# Patient Record
Sex: Female | Born: 1970 | Race: White | Hispanic: No | Marital: Married | Smoking: Never smoker
Health system: Southern US, Community
[De-identification: ages and names within clinical notes are randomized; demographics above are authoritative.]

## PROBLEM LIST (undated history)

## (undated) DIAGNOSIS — M1990 Unspecified osteoarthritis, unspecified site: Secondary | ICD-10-CM

## (undated) DIAGNOSIS — E7800 Pure hypercholesterolemia, unspecified: Secondary | ICD-10-CM

## (undated) DIAGNOSIS — K219 Gastro-esophageal reflux disease without esophagitis: Secondary | ICD-10-CM

## (undated) DIAGNOSIS — Z8041 Family history of malignant neoplasm of ovary: Secondary | ICD-10-CM

## (undated) DIAGNOSIS — T7840XA Allergy, unspecified, initial encounter: Secondary | ICD-10-CM

## (undated) DIAGNOSIS — Z1371 Encounter for nonprocreative screening for genetic disease carrier status: Secondary | ICD-10-CM

## (undated) HISTORY — DX: Unspecified osteoarthritis, unspecified site: M19.90

## (undated) HISTORY — DX: Encounter for nonprocreative screening for genetic disease carrier status: Z13.71

## (undated) HISTORY — DX: Family history of malignant neoplasm of ovary: Z80.41

## (undated) HISTORY — PX: INDUCED ABORTION: SHX677

## (undated) HISTORY — PX: WISDOM TOOTH EXTRACTION: SHX21

## (undated) HISTORY — PX: MOUTH SURGERY: SHX715

## (undated) HISTORY — DX: Allergy, unspecified, initial encounter: T78.40XA

## (undated) HISTORY — PX: UPPER GI ENDOSCOPY: SHX6162

## (undated) HISTORY — PX: CERVICAL ABLATION: SHX5771

## (undated) HISTORY — DX: Pure hypercholesterolemia, unspecified: E78.00

---

## 1994-08-14 HISTORY — PX: CRYOTHERAPY: SHX1416

## 2012-12-12 DIAGNOSIS — Z1371 Encounter for nonprocreative screening for genetic disease carrier status: Secondary | ICD-10-CM

## 2012-12-12 HISTORY — DX: Encounter for nonprocreative screening for genetic disease carrier status: Z13.71

## 2014-10-13 HISTORY — PX: NOVASURE ABLATION: SHX5394

## 2016-05-03 ENCOUNTER — Encounter: Payer: Self-pay | Admitting: Family

## 2016-05-03 ENCOUNTER — Ambulatory Visit (INDEPENDENT_AMBULATORY_CARE_PROVIDER_SITE_OTHER): Payer: BLUE CROSS/BLUE SHIELD | Admitting: Family

## 2016-05-03 VITALS — BP 118/78 | HR 82 | Temp 98.1°F | Ht 60.5 in | Wt 113.4 lb

## 2016-05-03 DIAGNOSIS — Z0000 Encounter for general adult medical examination without abnormal findings: Secondary | ICD-10-CM | POA: Insufficient documentation

## 2016-05-03 DIAGNOSIS — Z7189 Other specified counseling: Secondary | ICD-10-CM

## 2016-05-03 DIAGNOSIS — K219 Gastro-esophageal reflux disease without esophagitis: Secondary | ICD-10-CM

## 2016-05-03 DIAGNOSIS — Z7689 Persons encountering health services in other specified circumstances: Secondary | ICD-10-CM

## 2016-05-03 MED ORDER — OMEPRAZOLE 20 MG PO CPDR: 20.0000 mg | DELAYED_RELEASE_CAPSULE | Freq: Every day | ORAL | 3 refills | Status: DC

## 2016-05-03 NOTE — Progress Notes (Signed)
Pre visit review using our clinic review tool, if applicable. No additional management support is needed unless otherwise documented below in the visit note. 

## 2016-05-03 NOTE — Progress Notes (Signed)
Subjective:    Patient ID: Lauren Leach, female    DOB: 1971-04-19, 45 y.o.   MRN: [id number]  CC: Lauren Leach is a 45 y.o. female who presents today for follow up.   HPI: Patient presents to establish care as a new patient. She had not prior PCP.   Follows with [facility]- due for mammogram and Pap per patient. Had uterine ablation for fibroids.   GERD- Worsening past couple of months however 'had it all her life.' Zantac OTC. Drinking alcohol more since son went to college.  Drinking only on the weekend- 3 vodka drinks per night. If drinks beer, drinks 4 beers per night.  Coffee, fried foods, chips triggers it. Drinking water helps. Epigastric burning when lays down at night. Endorses hoarseness and belching. No cough. 'Feels knot in throat' and difficultly swallowing. Has lost 2 pounds. Great aunt with throat cancer. No night sweats, fever. Denies exertional chest pain or pressure, numbness or tingling radiating to left arm or jaw, palpitations, dizziness, frequent headaches, changes in vision, or shortness of breath. No black or tarry stools. No h/o GIB.         HISTORY:  Past Medical History:  Diagnosis Date  . Allergy   . Arthritis    Past Surgical History:  Procedure Laterality Date  . CERVICAL ABLATION     2/2 fibroids   Family History  Problem Relation Age of Onset  . Cancer Mother     sarcoma; patient tested and negative  . Hyperlipidemia Father   . Heart disease Father   . Hypertension Sister   . Hypertension Maternal Aunt     Allergies: Review of patient's allergies indicates not on file. No current outpatient prescriptions on file prior to visit.   No current facility-administered medications on file prior to visit.     Social History  Substance Use Topics  . Smoking status: Never Smoker  . Smokeless tobacco: Never Used  . Alcohol use Yes     Comment: rinking only on the weekend- 3 vodka drinks per night. If drinks beer, drinks 4 beers  per night.    Review of Systems  Constitutional: Positive for unexpected weight change. Negative for chills and fever.  HENT: Positive for trouble swallowing.   Respiratory: Negative for cough, shortness of breath and wheezing.   Cardiovascular: Negative for chest pain and palpitations.  Gastrointestinal: Negative for abdominal distention, abdominal pain, blood in stool, constipation, nausea and vomiting.      Objective:    BP 118/78   Pulse 82   Temp 98.1 F (36.7 C) (Oral)   Ht 5' 0.5" (1.537 m)   Wt 113 lb 6.4 oz (51.4 kg)   SpO2 99%   BMI 21.78 kg/m  BP Readings from Last 3 Encounters:  05/03/16 118/78   Wt Readings from Last 3 Encounters:  05/03/16 113 lb 6.4 oz (51.4 kg)    Physical Exam  Constitutional: She appears well-developed and well-nourished.  Eyes: Conjunctivae are normal.  Cardiovascular: Normal rate, regular rhythm, normal heart sounds and normal pulses.   Pulmonary/Chest: Effort normal and breath sounds normal. She has no wheezes. She has no rhonchi. She has no rales.  Neurological: She is alert.  Skin: Skin is warm and dry.  Psychiatric: She has a normal mood and affect. Her speech is normal and behavior is normal. Thought content normal.  Vitals reviewed.      Assessment & Plan:   Problem List Items Addressed This  Visit      Digestive   GERD (gastroesophageal reflux disease) - Primary    Worsening. Concern as patient has lost 2 pounds recently and describes feeling a "knot in her throat". I placed referral to GI for consult. Advised patient to eliminate alcohol and anti-inflammatories as have concern for gastric ulcer. Patient verbalized understanding of this. Start Prilosec 20 mg daily.      Relevant Medications   omeprazole (PRILOSEC) 20 MG capsule   Other Relevant Orders   Ambulatory referral to Gastroenterology     Other   Encounter to establish care    Reviewed patient's past medical and surgical history. Reviewed patient's social  history and family history. Encouraged patient to decrease drinking since it has  increased since her son has gone to college. Patient verbalized understanding.Requesting records. Encouraged to make follow-up appointment with OB/GYN to schedule mammogram and Pap smear. Patient will follow-up with CPE in couple months.       Other Visit Diagnoses   None.      I am having Ms. Brubeck start on omeprazole.   Meds ordered this encounter  Medications  . omeprazole (PRILOSEC) 20 MG capsule    Sig: Take 1 capsule (20 mg total) by mouth daily.    Dispense:  30 capsule    Refill:  3    Order Specific Question:   Supervising Provider    Answer:   Crecencio Mc [[id number]]    Return precautions given.   Risks, benefits, and alternatives of the medications and treatment plan prescribed today were discussed, and patient expressed understanding.   Education regarding symptom management and diagnosis given to patient on AVS.  Continue to follow with Mable Paris, FNP for routine health maintenance.   Lauren Leach and I agreed with plan.   Mable Paris, FNP

## 2016-05-03 NOTE — Patient Instructions (Signed)
Please limit/eliminate alcohol and NSAIDs ( aleve, diclofenac) while we await appointment with GI.   If there is no improvement in your symptoms, or if there is any worsening of symptoms, or if you have any additional concerns, please return for re-evaluation; or, if w Gastroesophageal Reflux Disease, Adult Normally, food travels down the esophagus and stays in the stomach to be digested. However, when a person has gastroesophageal reflux disease (GERD), food and stomach acid move back up into the esophagus. When this happens, the esophagus becomes sore and inflamed. Over time, GERD can create small holes (ulcers) in the lining of the esophagus.  CAUSES This condition is caused by a problem with the muscle between the esophagus and the stomach (lower esophageal sphincter, or LES). Normally, the LES muscle closes after food passes through the esophagus to the stomach. When the LES is weakened or abnormal, it does not close properly, and that allows food and stomach acid to go back up into the esophagus. The LES can be weakened by certain dietary substances, medicines, and medical conditions, including:  Tobacco use.  Pregnancy.  Having a hiatal hernia.  Heavy alcohol use.  Certain foods and beverages, such as coffee, chocolate, onions, and peppermint. RISK FACTORS This condition is more likely to develop in:  People who have an increased body weight.  People who have connective tissue disorders.  People who use NSAID medicines. SYMPTOMS Symptoms of this condition include:  Heartburn.  Difficult or painful swallowing.  The feeling of having a lump in the throat.  Abitter taste in the mouth.  Bad breath.  Having a large amount of saliva.  Having an upset or bloated stomach.  Belching.  Chest pain.  Shortness of breath or wheezing.  Ongoing (chronic) cough or a night-time cough.  Wearing away of tooth enamel.  Weight loss. Different conditions can cause chest pain.  Make sure to see your health care provider if you experience chest pain. DIAGNOSIS Your health care provider will take a medical history and perform a physical exam. To determine if you have mild or severe GERD, your health care provider may also monitor how you respond to treatment. You may also have other tests, including:  An endoscopy toexamine your stomach and esophagus with a small camera.  A test thatmeasures the acidity level in your esophagus.  A test thatmeasures how much pressure is on your esophagus.  A barium swallow or modified barium swallow to show the shape, size, and functioning of your esophagus. TREATMENT The goal of treatment is to help relieve your symptoms and to prevent complications. Treatment for this condition may vary depending on how severe your symptoms are. Your health care provider may recommend:  Changes to your diet.  Medicine.  Surgery. HOME CARE INSTRUCTIONS Diet  Follow a diet as recommended by your health care provider. This may involve avoiding foods and drinks such as:  Coffee and tea (with or without caffeine).  Drinks that containalcohol.  Energy drinks and sports drinks.  Carbonated drinks or sodas.  Chocolate and cocoa.  Peppermint and mint flavorings.  Garlic and onions.  Horseradish.  Spicy and acidic foods, including peppers, chili powder, curry powder, vinegar, hot sauces, and barbecue sauce.  Citrus fruit juices and citrus fruits, such as oranges, lemons, and limes.  Tomato-based foods, such as red sauce, chili, salsa, and pizza with red sauce.  Fried and fatty foods, such as donuts, french fries, potato chips, and high-fat dressings.  High-fat meats, such as hot dogs and  fatty cuts of red and white meats, such as rib eye steak, sausage, ham, and bacon.  High-fat dairy items, such as whole milk, butter, and cream cheese.  Eat small, frequent meals instead of large meals.  Avoid drinking large amounts of liquid  with your meals.  Avoid eating meals during the 2-3 hours before bedtime.  Avoid lying down right after you eat.  Do not exercise right after you eat. General Instructions  Pay attention to any changes in your symptoms.  Take over-the-counter and prescription medicines only as told by your health care provider. Do not take aspirin, ibuprofen, or other NSAIDs unless your health care provider told you to do so.  Do not use any tobacco products, including cigarettes, chewing tobacco, and e-cigarettes. If you need help quitting, ask your health care provider.  Wear loose-fitting clothing. Do not wear anything tight around your waist that causes pressure on your abdomen.  Raise (elevate) the head of your bed 6 inches (15cm).  Try to reduce your stress, such as with yoga or meditation. If you need help reducing stress, ask your health care provider.  If you are overweight, reduce your weight to an amount that is healthy for you. Ask your health care provider for guidance about a safe weight loss goal.  Keep all follow-up visits as told by your health care provider. This is important. SEEK MEDICAL CARE IF:  You have new symptoms.  You have unexplained weight loss.  You have difficulty swallowing, or it hurts to swallow.  You have wheezing or a persistent cough.  Your symptoms do not improve with treatment.  You have a hoarse voice. SEEK IMMEDIATE MEDICAL CARE IF:  You have pain in your arms, neck, jaw, teeth, or back.  You feel sweaty, dizzy, or light-headed.  You have chest pain or shortness of breath.  You vomit and your vomit looks like blood or coffee grounds.  You faint.  Your stool is bloody or black.  You cannot swallow, drink, or eat.   This information is not intended to replace advice given to you by your health care provider. Make sure you discuss any questions you have with your health care provider.   Document Released: 05/10/2005 Document Revised:  04/21/2015 Document Reviewed: 11/25/2014 Elsevier Interactive Patient Education [employer]. e are closed, consider going to the Emergency Room for evaluation if symptoms urgent.

## 2016-05-03 NOTE — Assessment & Plan Note (Signed)
Worsening. Concern as patient has lost 2 pounds recently and describes feeling a "knot in her throat". I placed referral to GI for consult. Advised patient to eliminate alcohol and anti-inflammatories as have concern for gastric ulcer. Patient verbalized understanding of this. Start Prilosec 20 mg daily.

## 2016-05-03 NOTE — Assessment & Plan Note (Addendum)
Reviewed patient's past medical and surgical history. Reviewed patient's social history and family history. Encouraged patient to decrease drinking since it has  increased since her son has gone to college. Patient verbalized understanding.Requesting records. Encouraged to make follow-up appointment with OB/GYN to schedule mammogram and Pap smear. Patient will follow-up with CPE in couple months.

## 2016-05-09 ENCOUNTER — Encounter: Payer: Self-pay | Admitting: Family

## 2016-06-14 DIAGNOSIS — Z8041 Family history of malignant neoplasm of ovary: Secondary | ICD-10-CM

## 2016-06-14 HISTORY — DX: Family history of malignant neoplasm of ovary: Z80.41

## 2016-07-17 ENCOUNTER — Encounter: Payer: Self-pay | Admitting: Family

## 2016-07-17 ENCOUNTER — Ambulatory Visit
Admission: RE | Admit: 2016-07-17 | Discharge: 2016-07-17 | Disposition: A | Discharge status: Home / Self Care | Payer: BLUE CROSS/BLUE SHIELD | Source: Ambulatory Visit | Attending: Family | Admitting: Family

## 2016-07-17 ENCOUNTER — Ambulatory Visit (INDEPENDENT_AMBULATORY_CARE_PROVIDER_SITE_OTHER): Payer: BLUE CROSS/BLUE SHIELD | Admitting: Family

## 2016-07-17 VITALS — BP 128/78 | HR 89 | Temp 98.2°F | Ht 61.0 in | Wt 114.8 lb

## 2016-07-17 DIAGNOSIS — Z0001 Encounter for general adult medical examination with abnormal findings: Secondary | ICD-10-CM | POA: Diagnosis not present

## 2016-07-17 DIAGNOSIS — D259 Leiomyoma of uterus, unspecified: Secondary | ICD-10-CM | POA: Insufficient documentation

## 2016-07-17 DIAGNOSIS — Z0000 Encounter for general adult medical examination without abnormal findings: Secondary | ICD-10-CM

## 2016-07-17 DIAGNOSIS — Z7689 Persons encountering health services in other specified circumstances: Secondary | ICD-10-CM

## 2016-07-17 NOTE — Assessment & Plan Note (Signed)
Due to history of fibroids and left status suprapubic pain, concern for fibroid. Pending pelvic and transvaginal ultrasound.

## 2016-07-17 NOTE — Progress Notes (Signed)
Pre visit review using our clinic review tool, if applicable. No additional management support is needed unless otherwise documented below in the visit note. 

## 2016-07-17 NOTE — Progress Notes (Signed)
Subjective:    Patient ID: Lauren Leach, female    DOB: [birth year], 45 y.o.   MRN: [id number]  CC: Lauren Leach is a 45 y.o. female who presents today for physical exam.    HPI: H/o fibroids s/p ablation 2 years ago. Lately has been having intermittent left suprapubic pain. Sharp cramping pain. Endorses nausea with pain. Resolved with ibuprofen and heating pad. No pain with intercourse. Did have recent yeast infection and treated with OTC monitstat. No fever, chills, V, diarrhea. H/o of kidney stone.   LMP- Unsure. Doesn't bleed since ablation.     Colorectal Cancer Screening: No early family ; deferred.  Breast Cancer Screening: Due Cervical Cancer Screening: [facility]- had normal pap 2017  per patient. No records of this.  Bone Health screening/DEXA for 65+: No increased fracture risk. Defer screening at this time. Lung Cancer Screening: Doesn't have 30 year pack year history and age > 16 years. Immunizations       Tetanus - Due    HIV Screening- Candidate for Labs: Screening labs today. Exercise: Gets regular exercise.  Alcohol use: Occasional Smoking/tobacco use: Nonsmoker.  Regular dental exams: In need of dental exam. Wears seat belt: Yes.  HISTORY:  Past Medical History:  Diagnosis Date  . Allergy   . Arthritis     Past Surgical History:  Procedure Laterality Date  . CERVICAL ABLATION     2/2 fibroids   Family History  Problem Relation Age of Onset  . Cancer Mother     ovarian sarcoma; patient tested and negative  . Skin cancer Mother   . Hyperlipidemia Father   . Heart disease Father   . Hypertension Sister   . Skin cancer Sister     thinks basal  . Hypertension Maternal Aunt   . Skin cancer Maternal Aunt   . Colon cancer Neg Hx       ALLERGIES: Patient has no allergy information on record.  Current Outpatient Prescriptions on File Prior to Visit  Medication Sig Dispense Refill  . omeprazole (PRILOSEC) 20 MG capsule Take 1  capsule (20 mg total) by mouth daily. 30 capsule 3   No current facility-administered medications on file prior to visit.     Social History  Substance Use Topics  . Smoking status: Never Smoker  . Smokeless tobacco: Never Used  . Alcohol use Yes     Comment: rinking only on the weekend- 3 vodka drinks per night. If drinks beer, drinks 4 beers per night.    Review of Systems  Constitutional: Negative for chills, fever and unexpected weight change.  HENT: Negative for congestion.   Respiratory: Negative for cough.   Cardiovascular: Negative for chest pain, palpitations and leg swelling.  Gastrointestinal: Positive for nausea. Negative for abdominal distention, abdominal pain, constipation, diarrhea and vomiting.  Genitourinary: Negative for dysuria, frequency, vaginal bleeding, vaginal discharge and vaginal pain.  Musculoskeletal: Negative for arthralgias and myalgias.  Skin: Negative for rash.  Neurological: Negative for headaches.  Hematological: Negative for adenopathy.  Psychiatric/Behavioral: Negative for confusion.      Objective:    BP 128/78   Pulse 89   Temp 98.2 F (36.8 C) (Oral)   Ht 5\' 1"  (1.549 m)   Wt 114 lb 12 oz (52.1 kg)   SpO2 99%   BMI 21.68 kg/m   BP Readings from Last 3 Encounters:  07/17/16 128/78  05/03/16 118/78   Wt Readings from Last 3 Encounters:  07/17/16 114  lb 12 oz (52.1 kg)  05/03/16 113 lb 6.4 oz (51.4 kg)    Physical Exam  Constitutional: She appears well-developed and well-nourished.  Eyes: Conjunctivae are normal.  Neck: No thyroid mass and no thyromegaly present.  Cardiovascular: Normal rate, regular rhythm, normal heart sounds and normal pulses.   Pulmonary/Chest: Effort normal and breath sounds normal. She has no wheezes. She has no rhonchi. She has no rales. Right breast exhibits no inverted nipple, no mass, no nipple discharge, no skin change and no tenderness. Left breast exhibits no inverted nipple, no mass, no nipple  discharge, no skin change and no tenderness. Breasts are symmetrical.  CBE performed.   Abdominal: There is no CVA tenderness.  Genitourinary: There is no rash, tenderness or lesion on the right labia. There is no rash, tenderness or lesion on the left labia. Cervix exhibits no motion tenderness and no discharge. Right adnexum displays no mass, no tenderness and no fullness. Left adnexum displays tenderness. Left adnexum displays no mass and no fullness. No erythema, tenderness or bleeding in the vagina. No foreign body in the vagina. No vaginal discharge found.  Genitourinary Comments: No vulvovaginal erythema. No lesions. Discharge is thin and clear, not purulent.   Lymphadenopathy:       Head (right side): No submental, no submandibular, no tonsillar, no preauricular, no posterior auricular and no occipital adenopathy present.       Head (left side): No submental, no submandibular, no tonsillar, no preauricular, no posterior auricular and no occipital adenopathy present.    She has no cervical adenopathy.       Right cervical: No superficial cervical, no deep cervical and no posterior cervical adenopathy present.      Left cervical: No superficial cervical, no deep cervical and no posterior cervical adenopathy present.    She has no axillary adenopathy.  Neurological: She is alert.  Skin: Skin is warm and dry.  Psychiatric: She has a normal mood and affect. Her speech is normal and behavior is normal. Thought content normal.  Vitals reviewed.      Assessment & Plan:   Problem List Items Addressed This Visit      Genitourinary   Uterine fibroid    Due to history of fibroids and left status suprapubic pain, concern for fibroid. Pending pelvic and transvaginal ultrasound.      Relevant Orders   US OB Transvaginal   US Pelvis Complete     Other   Encounter to establish care    Due for mammogram; patient will schedule. Up-to-date on Pap smear per patient. Requesting records from  patient. Deferred DEXA scan and colonoscopy. Advised tetanus local pharmacy due to cost. Screening labs including HIV. CBE performed. Encouraged regular exercise.       Other Visit Diagnoses    Routine physical examination    -  Primary   Relevant Orders   CBC with Differential/Platelet   Comprehensive metabolic panel   Hemoglobin A1c   HIV antibody   Lipid panel   TSH   VITAMIN D 25 Hydroxy (Vit-D Deficiency, Fractures)   MM DIGITAL SCREENING BILATERAL   Ambulatory referral to Dermatology       I am having Ms. Brandes maintain her omeprazole.   No orders of the defined types were placed in this encounter.   Return precautions given.   Risks, benefits, and alternatives of the medications and treatment plan prescribed today were discussed, and patient expressed understanding.   Education regarding symptom management and diagnosis given to  patient on AVS.   Continue to follow with Mable Paris, FNP for routine health maintenance.   Lauren Leach and I agreed with plan.   Mable Paris, FNP

## 2016-07-17 NOTE — Patient Instructions (Addendum)
Tetanus vaccine at pharmacy   Fasting labs next week.  Abdominal US for suspect fibroids  Health Maintenance, Female Introduction Adopting a healthy lifestyle and getting preventive care can go a long way to promote health and wellness. Talk with your health care provider about what schedule of regular examinations is right for you. This is a good chance for you to check in with your provider about disease prevention and staying healthy. In between checkups, there are plenty of things you can do on your own. Experts have done a lot of research about which lifestyle changes and preventive measures are most likely to keep you healthy. Ask your health care provider for more information. Weight and diet Eat a healthy diet  Be sure to include plenty of vegetables, fruits, low-fat dairy products, and lean protein.  Do not eat a lot of foods high in solid fats, added sugars, or salt.  Get regular exercise. This is one of the most important things you can do for your health.  Most adults should exercise for at least 150 minutes each week. The exercise should increase your heart rate and make you sweat (moderate-intensity exercise).  Most adults should also do strengthening exercises at least twice a week. This is in addition to the moderate-intensity exercise. Maintain a healthy weight  Body mass index (BMI) is a measurement that can be used to identify possible weight problems. It estimates body fat based on height and weight. Your health care provider can help determine your BMI and help you achieve or maintain a healthy weight.  For females 23 years of age and older:  A BMI below 18.5 is considered underweight.  A BMI of 18.5 to 24.9 is normal.  A BMI of 25 to 29.9 is considered overweight.  A BMI of 30 and above is considered obese. Watch levels of cholesterol and blood lipids  You should start having your blood tested for lipids and cholesterol at 44 years of age, then have this  test every 5 years.  You may need to have your cholesterol levels checked more often if:  Your lipid or cholesterol levels are high.  You are older than 44 years of age.  You are at high risk for heart disease. Cancer screening Lung Cancer  Lung cancer screening is recommended for adults 36-32 years old who are at high risk for lung cancer because of a history of smoking.  A yearly low-dose CT scan of the lungs is recommended for people who:  Currently smoke.  Have quit within the past 15 years.  Have at least a 30-pack-year history of smoking. A pack year is smoking an average of one pack of cigarettes a day for 1 year.  Yearly screening should continue until it has been 15 years since you quit.  Yearly screening should stop if you develop a health problem that would prevent you from having lung cancer treatment. Breast Cancer  Practice breast self-awareness. This means understanding how your breasts normally appear and feel.  It also means doing regular breast self-exams. Let your health care provider know about any changes, no matter how small.  If you are in your 20s or 30s, you should have a clinical breast exam (CBE) by a health care provider every 1-3 years as part of a regular health exam.  If you are 41 or older, have a CBE every year. Also consider having a breast X-ray (mammogram) every year.  If you have a family history of breast cancer, talk to  your health care provider about genetic screening.  If you are at high risk for breast cancer, talk to your health care provider about having an MRI and a mammogram every year.  Breast cancer gene (BRCA) assessment is recommended for women who have family members with BRCA-related cancers. BRCA-related cancers include:  Breast.  Ovarian.  Tubal.  Peritoneal cancers.  Results of the assessment will determine the need for genetic counseling and BRCA1 and BRCA2 testing. Cervical Cancer  Your health care provider  may recommend that you be screened regularly for cancer of the pelvic organs (ovaries, uterus, and vagina). This screening involves a pelvic examination, including checking for microscopic changes to the surface of your cervix (Pap test). You may be encouraged to have this screening done every 3 years, beginning at age 21.  For women ages 30-65, health care providers may recommend pelvic exams and Pap testing every 3 years, or they may recommend the Pap and pelvic exam, combined with testing for human papilloma virus (HPV), every 5 years. Some types of HPV increase your risk of cervical cancer. Testing for HPV may also be done on women of any age with unclear Pap test results.  Other health care providers may not recommend any screening for nonpregnant women who are considered low risk for pelvic cancer and who do not have symptoms. Ask your health care provider if a screening pelvic exam is right for you.  If you have had past treatment for cervical cancer or a condition that could lead to cancer, you need Pap tests and screening for cancer for at least 20 years after your treatment. If Pap tests have been discontinued, your risk factors (such as having a new sexual partner) need to be reassessed to determine if screening should resume. Some women have medical problems that increase the chance of getting cervical cancer. In these cases, your health care provider may recommend more frequent screening and Pap tests. Colorectal Cancer  This type of cancer can be detected and often prevented.  Routine colorectal cancer screening usually begins at 44 years of age and continues through 44 years of age.  Your health care provider may recommend screening at an earlier age if you have risk factors for colon cancer.  Your health care provider may also recommend using home test kits to check for hidden blood in the stool.  A small camera at the end of a tube can be used to examine your colon directly  (sigmoidoscopy or colonoscopy). This is done to check for the earliest forms of colorectal cancer.  Routine screening usually begins at age 50.  Direct examination of the colon should be repeated every 5-10 years through 44 years of age. However, you may need to be screened more often if early forms of precancerous polyps or small growths are found. Skin Cancer  Check your skin from head to toe regularly.  Tell your health care provider about any new moles or changes in moles, especially if there is a change in a mole's shape or color.  Also tell your health care provider if you have a mole that is larger than the size of a pencil eraser.  Always use sunscreen. Apply sunscreen liberally and repeatedly throughout the day.  Protect yourself by wearing long sleeves, pants, a wide-brimmed hat, and sunglasses whenever you are outside. Heart disease, diabetes, and high blood pressure  High blood pressure causes heart disease and increases the risk of stroke. High blood pressure is more likely to develop in:    People who have blood pressure in the high end of the normal range (130-139/85-89 mm Hg).  People who are overweight or obese.  People who are African American.  If you are 18-39 years of age, have your blood pressure checked every 3-5 years. If you are 40 years of age or older, have your blood pressure checked every year. You should have your blood pressure measured twice-once when you are at a hospital or clinic, and once when you are not at a hospital or clinic. Record the average of the two measurements. To check your blood pressure when you are not at a hospital or clinic, you can use:  An automated blood pressure machine at a pharmacy.  A home blood pressure monitor.  If you are between 55 years and 79 years old, ask your health care provider if you should take aspirin to prevent strokes.  Have regular diabetes screenings. This involves taking a blood sample to check your  fasting blood sugar level.  If you are at a normal weight and have a low risk for diabetes, have this test once every three years after 45 years of age.  If you are overweight and have a high risk for diabetes, consider being tested at a younger age or more often. Preventing infection Hepatitis B  If you have a higher risk for hepatitis B, you should be screened for this virus. You are considered at high risk for hepatitis B if:  You were born in a country where hepatitis B is common. Ask your health care provider which countries are considered high risk.  Your parents were born in a high-risk country, and you have not been immunized against hepatitis B (hepatitis B vaccine).  You have HIV or AIDS.  You use needles to inject street drugs.  You live with someone who has hepatitis B.  You have had sex with someone who has hepatitis B.  You get hemodialysis treatment.  You take certain medicines for conditions, including cancer, organ transplantation, and autoimmune conditions. Hepatitis C  Blood testing is recommended for:  Everyone born from 1945 through 1965.  Anyone with known risk factors for hepatitis C. Sexually transmitted infections (STIs)  You should be screened for sexually transmitted infections (STIs) including gonorrhea and chlamydia if:  You are sexually active and are younger than 44 years of age.  You are older than 44 years of age and your health care provider tells you that you are at risk for this type of infection.  Your sexual activity has changed since you were last screened and you are at an increased risk for chlamydia or gonorrhea. Ask your health care provider if you are at risk.  If you do not have HIV, but are at risk, it may be recommended that you take a prescription medicine daily to prevent HIV infection. This is called pre-exposure prophylaxis (PrEP). You are considered at risk if:  You are sexually active and do not regularly use condoms or  know the HIV status of your partner(s).  You take drugs by injection.  You are sexually active with a partner who has HIV. Talk with your health care provider about whether you are at high risk of being infected with HIV. If you choose to begin PrEP, you should first be tested for HIV. You should then be tested every 3 months for as long as you are taking PrEP. Pregnancy  If you are premenopausal and you may become pregnant, ask your health care provider about   preconception counseling.  If you may become pregnant, take 400 to 800 micrograms (mcg) of folic acid every day.  If you want to prevent pregnancy, talk to your health care provider about birth control (contraception). Osteoporosis and menopause  Osteoporosis is a disease in which the bones lose minerals and strength with aging. This can result in serious bone fractures. Your risk for osteoporosis can be identified using a bone density scan.  If you are 60 years of age or older, or if you are at risk for osteoporosis and fractures, ask your health care provider if you should be screened.  Ask your health care provider whether you should take a calcium or vitamin D supplement to lower your risk for osteoporosis.  Menopause may have certain physical symptoms and risks.  Hormone replacement therapy may reduce some of these symptoms and risks. Talk to your health care provider about whether hormone replacement therapy is right for you. Follow these instructions at home:  Schedule regular health, dental, and eye exams.  Stay current with your immunizations.  Do not use any tobacco products including cigarettes, chewing tobacco, or electronic cigarettes.  If you are pregnant, do not drink alcohol.  If you are breastfeeding, limit how much and how often you drink alcohol.  Limit alcohol intake to no more than 1 drink per day for nonpregnant women. One drink equals 12 ounces of beer, 5 ounces of wine, or 1 ounces of hard  liquor.  Do not use street drugs.  Do not share needles.  Ask your health care provider for help if you need support or information about quitting drugs.  Tell your health care provider if you often feel depressed.  Tell your health care provider if you have ever been abused or do not feel safe at home. This information is not intended to replace advice given to you by your health care provider. Make sure you discuss any questions you have with your health care provider. Document Released: 02/13/2011 Document Revised: 01/06/2016 Document Reviewed: 05/04/2015  2017 Elsevier

## 2016-07-17 NOTE — Assessment & Plan Note (Signed)
Due for mammogram; patient will schedule. Up-to-date on Pap smear per patient. Requesting records from patient. Deferred DEXA scan and colonoscopy. Advised tetanus local pharmacy due to cost. Screening labs including HIV. CBE performed. Encouraged regular exercise.

## 2016-07-18 NOTE — Progress Notes (Signed)
Patient has appointment for labs on Thursday. Also seeing GYN provider today at 1500

## 2016-07-20 ENCOUNTER — Other Ambulatory Visit: Payer: BLUE CROSS/BLUE SHIELD

## 2016-08-03 ENCOUNTER — Telehealth: Payer: Self-pay | Admitting: *Deleted

## 2016-08-03 ENCOUNTER — Other Ambulatory Visit: Payer: BLUE CROSS/BLUE SHIELD

## 2016-08-03 DIAGNOSIS — Z0000 Encounter for general adult medical examination without abnormal findings: Secondary | ICD-10-CM

## 2016-08-03 NOTE — Telephone Encounter (Signed)
Pt came in for labs ([employer] employee). She was told that her provider did not know how to place orders for her to have drawn at [facility]. I changed her labs for her because pt preferred to go to [place] instead.

## 2016-08-04 LAB — COMPREHENSIVE METABOLIC PANEL
ALBUMIN: 4.6 g/dL (ref 3.5–5.5)
ALT: 25 IU/L (ref 0–32)
AST: 23 IU/L (ref 0–40)
Albumin/Globulin Ratio: 1.9 (ref 1.2–2.2)
Alkaline Phosphatase: 65 IU/L (ref 39–117)
BUN/Creatinine Ratio: 22 (ref 9–23)
BUN: 15 mg/dL (ref 6–24)
Bilirubin Total: 0.6 mg/dL (ref 0.0–1.2)
CO2: 23 mmol/L (ref 18–29)
CREATININE: 0.69 mg/dL (ref 0.57–1.00)
Calcium: 9.3 mg/dL (ref 8.7–10.2)
Chloride: 99 mmol/L (ref 96–106)
GFR calc Af Amer: 122 mL/min/{1.73_m2} (ref >59)
GFR, EST NON AFRICAN AMERICAN: 106 mL/min/{1.73_m2} (ref >59)
Globulin, Total: 2.4 g/dL (ref 1.5–4.5)
Glucose: 97 mg/dL (ref 65–99)
Potassium: 4 mmol/L (ref 3.5–5.2)
SODIUM: 139 mmol/L (ref 134–144)
TOTAL PROTEIN: 7 g/dL (ref 6.0–8.5)

## 2016-08-04 LAB — CBC WITH DIFFERENTIAL/PLATELET
BASOS ABS: 0 10*3/uL (ref 0.0–0.2)
BASOS: 0 %
EOS (ABSOLUTE): 0.1 10*3/uL (ref 0.0–0.4)
Eos: 2 %
Hematocrit: 36.6 % (ref 34.0–46.6)
Hemoglobin: 12.5 g/dL (ref 11.1–15.9)
IMMATURE GRANS (ABS): 0 10*3/uL (ref 0.0–0.1)
Immature Granulocytes: 0 %
LYMPHS ABS: 2 10*3/uL (ref 0.7–3.1)
LYMPHS: 41 %
MCH: 32.7 pg (ref 26.6–33.0)
MCHC: 34.2 g/dL (ref 31.5–35.7)
MCV: 96 fL (ref 79–97)
MONOS ABS: 0.4 10*3/uL (ref 0.1–0.9)
Monocytes: 7 %
NEUTROS ABS: 2.4 10*3/uL (ref 1.4–7.0)
Neutrophils: 50 %
PLATELETS: 269 10*3/uL (ref 150–379)
RBC: 3.82 x10E6/uL (ref 3.77–5.28)
RDW: 12 % — ABNORMAL LOW (ref 12.3–15.4)
WBC: 4.8 10*3/uL (ref 3.4–10.8)

## 2016-08-04 LAB — VITAMIN D 25 HYDROXY (VIT D DEFICIENCY, FRACTURES): VIT D 25 HYDROXY: 47.2 ng/mL (ref 30.0–100.0)

## 2016-08-04 LAB — HCG, SERUM, QUALITATIVE: HCG, BETA SUBUNIT, QUAL, SERUM: NEGATIVE m[IU]/mL (ref <6)

## 2016-08-04 LAB — TSH: TSH: 1.86 u[IU]/mL (ref 0.450–4.500)

## 2016-08-04 LAB — LIPID PANEL
CHOL/HDL RATIO: 3.6 ratio (ref 0.0–4.4)
CHOLESTEROL TOTAL: 213 mg/dL — AB (ref 100–199)
HDL: 59 mg/dL (ref >39)
LDL Calculated: 136 mg/dL — ABNORMAL HIGH (ref 0–99)
TRIGLYCERIDES: 92 mg/dL (ref 0–149)
VLDL Cholesterol Cal: 18 mg/dL (ref 5–40)

## 2016-08-04 LAB — HIV ANTIBODY (ROUTINE TESTING W REFLEX): HIV Screen 4th Generation wRfx: NONREACTIVE

## 2016-08-04 LAB — HEMOGLOBIN A1C
ESTIMATED AVERAGE GLUCOSE: 114 mg/dL
Hgb A1c MFr Bld: 5.6 % (ref 4.8–5.6)

## 2016-12-25 ENCOUNTER — Ambulatory Visit (INDEPENDENT_AMBULATORY_CARE_PROVIDER_SITE_OTHER): Payer: BLUE CROSS/BLUE SHIELD | Admitting: Family

## 2016-12-25 ENCOUNTER — Encounter: Payer: Self-pay | Admitting: Family

## 2016-12-25 VITALS — BP 136/76 | HR 75 | Temp 98.3°F | Ht 61.0 in | Wt 117.4 lb

## 2016-12-25 DIAGNOSIS — J0110 Acute frontal sinusitis, unspecified: Secondary | ICD-10-CM

## 2016-12-25 DIAGNOSIS — B351 Tinea unguium: Secondary | ICD-10-CM | POA: Diagnosis not present

## 2016-12-25 MED ORDER — AMOXICILLIN 500 MG PO CAPS: 500.0000 mg | ORAL_CAPSULE | Freq: Two times a day (BID) | ORAL | 0 refills | Status: DC

## 2016-12-25 NOTE — Progress Notes (Signed)
Subjective:    Patient ID: Lauren Leach, female    DOB: [birth year], 46 y.o.   MRN: [id number]  CC: Lauren Leach is a 46 y.o. female who presents today for an acute visit.    HPI: CC: sinus congestion x 2 weeks, unchanged.    Endorses 'gritty dry eyes' for months. Has tried dry eye drops with no relief. No eye discharge.  Describes eyes 'burning' and needs 'to blink. ' Notes her vision has slowly worsened.  In front of computer, ipad most of day. No  no contact lenses.   Endorses  pressure HA ( improved now).  No sudden vision changes, foreign body sensation  Tried OTC zyrtec, severe cold and sinus, sudafed motrin with no relief    h/o seasonal allergies   Also complains of toenail fungus and has tried many topical medications without improvement.      HISTORY:  Past Medical History:  Diagnosis Date  . Allergy   . Arthritis    Past Surgical History:  Procedure Laterality Date  . CERVICAL ABLATION     2/2 fibroids   Family History  Problem Relation Age of Onset  . Cancer Mother        ovarian sarcoma; patient tested and negative  . Skin cancer Mother   . Hyperlipidemia Father   . Heart disease Father   . Hypertension Sister   . Skin cancer Sister        thinks basal  . Hypertension Maternal Aunt   . Skin cancer Maternal Aunt   . Colon cancer Neg Hx     Allergies: Patient has no allergy information on record. Current Outpatient Prescriptions on File Prior to Visit  Medication Sig Dispense Refill  . omeprazole (PRILOSEC) 20 MG capsule Take 1 capsule (20 mg total) by mouth daily. 30 capsule 3   No current facility-administered medications on file prior to visit.     Social History  Substance Use Topics  . Smoking status: Never Smoker  . Smokeless tobacco: Never Used  . Alcohol use Yes     Comment: rinking only on the weekend- 3 vodka drinks per night. If drinks beer, drinks 4 beers per night.    Review of Systems  Constitutional: Negative for chills and  fever.  HENT: Positive for congestion, postnasal drip and sinus pressure. Negative for ear discharge, ear pain and sore throat.   Eyes: Negative for visual disturbance.  Respiratory: Negative for cough, shortness of breath and wheezing.   Cardiovascular: Negative for chest pain and palpitations.  Gastrointestinal: Negative for nausea and vomiting.  Neurological: Negative for headaches.      Objective:    BP 136/76   Pulse 75   Temp 98.3 F (36.8 C) (Oral)   Ht 5\' 1"  (1.549 m)   Wt 117 lb 6.4 oz (53.3 kg)   SpO2 98%   BMI 22.18 kg/m    Physical Exam  Constitutional: She appears well-developed and well-nourished.  HENT:  Head: Normocephalic and atraumatic.  Right Ear: Hearing, tympanic membrane, external ear and ear canal normal. No drainage, swelling or tenderness. No foreign bodies. Tympanic membrane is not erythematous and not bulging. No middle ear effusion. No decreased hearing is noted.  Left Ear: Hearing, tympanic membrane, external ear and ear canal normal. No drainage, swelling or tenderness. No foreign bodies. Tympanic membrane is not erythematous and not bulging.  No middle ear effusion. No decreased hearing is noted.  Nose: No rhinorrhea. Right sinus exhibits maxillary sinus tenderness and frontal  sinus tenderness. Left sinus exhibits maxillary sinus tenderness and frontal sinus tenderness.  Mouth/Throat: Uvula is midline, oropharynx is clear and moist and mucous membranes are normal. No oropharyngeal exudate, posterior oropharyngeal edema, posterior oropharyngeal erythema or tonsillar abscesses.  Mild diffuse sinus tenderness  Eyes: Conjunctivae and EOM are normal. Pupils are equal, round, and reactive to light. Lids are everted and swept, no foreign bodies found. Right eye exhibits no discharge. Left eye exhibits no discharge and no hordeolum. Right conjunctiva is not injected. Right conjunctiva has no hemorrhage. Left conjunctiva is not injected. Left conjunctiva has no  hemorrhage. No scleral icterus.  No external eye lesions. Surrounding skin intact.   Bilateral eys:   No  injection of the conjunctiva. No white spots, opacity, or foreign body appreciated. No collection of blood or pus in the anterior chamber. No ciliary flush surrounding iris.   No photophobia or eye pain appreciated during exam.   Cardiovascular: Regular rhythm, normal heart sounds and normal pulses.   Pulmonary/Chest: Effort normal and breath sounds normal. She has no wheezes. She has no rhonchi. She has no rales.  Lymphadenopathy:       Head (right side): No submental, no submandibular, no tonsillar, no preauricular, no posterior auricular and no occipital adenopathy present.       Head (left side): No submental, no submandibular, no tonsillar, no preauricular, no posterior auricular and no occipital adenopathy present.    She has no cervical adenopathy.  Neurological: She is alert.  Skin: Skin is warm and dry.  Psychiatric: She has a normal mood and affect. Her speech is normal and behavior is normal. Thought content normal.  Vitals reviewed.      Assessment & Plan:   1. Acute non-recurrent frontal sinusitis Afebrile. Based on duration of symptoms and failure of OTC medications, we agreed to start antibiotic. Discussed with patient likely dry eye related to screen time and subtle vision changes over time. Advised to make an appointment with eye doctor, trial Systane. She will let me know if not better on antibiotic.   - amoxicillin (AMOXIL) 500 MG capsule; Take 1 capsule (500 mg total) by mouth 2 (two) times daily.  Dispense: 14 capsule; Refill: 0  2. Onychomycosis Referral to podiatry.  - Ambulatory referral to Podiatry    I am having Ms. Betsch start on amoxicillin. I am also having her maintain her omeprazole.   Meds ordered this encounter  Medications  . amoxicillin (AMOXIL) 500 MG capsule    Sig: Take 1 capsule (500 mg total) by mouth 2 (two) times daily.     Dispense:  14 capsule    Refill:  0    Order Specific Question:   Supervising Provider    Answer:   Crecencio Mc [[id number]]    Return precautions given.   Risks, benefits, and alternatives of the medications and treatment plan prescribed today were discussed, and patient expressed understanding.   Education regarding symptom management and diagnosis given to patient on AVS.  Continue to follow with Burnard Hawthorne, FNP for routine health maintenance.   Lauren Leach and I agreed with plan.   Mable Paris, FNP

## 2016-12-25 NOTE — Progress Notes (Signed)
Pre visit review using our clinic review tool, if applicable. No additional management support is needed unless otherwise documented below in the visit note. 

## 2016-12-25 NOTE — Patient Instructions (Addendum)
Trial systane  mucinex  Start amoxicillin  Ensure to take probiotics while on antibiotics and also for 2 weeks after completion. It is important to re-colonize the gut with good bacteria and also to prevent any diarrheal infections associated with antibiotic use.   Let me know if not better

## 2017-01-05 ENCOUNTER — Ambulatory Visit: Payer: BLUE CROSS/BLUE SHIELD | Admitting: Podiatry

## 2017-01-30 ENCOUNTER — Ambulatory Visit (INDEPENDENT_AMBULATORY_CARE_PROVIDER_SITE_OTHER): Payer: BLUE CROSS/BLUE SHIELD | Admitting: Podiatry

## 2017-01-30 DIAGNOSIS — Z79899 Other long term (current) drug therapy: Secondary | ICD-10-CM

## 2017-01-30 DIAGNOSIS — B351 Tinea unguium: Secondary | ICD-10-CM

## 2017-01-31 LAB — HEPATIC FUNCTION PANEL
ALK PHOS: 71 IU/L (ref 39–117)
ALT: 21 IU/L (ref 0–32)
AST: 20 IU/L (ref 0–40)
Albumin: 4.7 g/dL (ref 3.5–5.5)
BILIRUBIN TOTAL: 0.5 mg/dL (ref 0.0–1.2)
BILIRUBIN, DIRECT: 0.12 mg/dL (ref 0.00–0.40)
Total Protein: 7.4 g/dL (ref 6.0–8.5)

## 2017-02-03 NOTE — Progress Notes (Signed)
   Subjective: Patient presents today for possible treatment and evaluation of fungus of the right great toenail that began approximately 3 years ago. She has been applying white vinegar and OTC fungal liquid with no significant improvement. Patient presents today for further treatment and evaluation.  Objective: Physical Exam General: The patient is alert and oriented x3 in no acute distress.  Dermatology: Hyperkeratotic, discolored, thickened, onychodystrophy of nails noted bilaterally.  Skin is warm, dry and supple bilateral lower extremities. Negative for open lesions or macerations.  Vascular: Palpable pedal pulses bilaterally. No edema or erythema noted. Capillary refill within normal limits.  Neurological: Epicritic and protective threshold grossly intact bilaterally.   Musculoskeletal Exam: Range of motion within normal limits to all pedal and ankle joints bilateral. Muscle strength 5/5 in all groups bilateral.   Assessment: #1 onychodystrophy bilateral great toenails #2 possible onychomycosis #3 hyperkeratotic nails bilateral  Plan of Care:  #1 Patient was evaluated. #2 Orders for liver function tests were ordered today.  #3 prescription for the nail given to patient. #4 Return to clinic in 6 months  Patient works at [employer].   Edrick Kins, DPM [facility]  Dr. Edrick Kins, [address]                                        [place], [place] [address]                Office [phone]  Fax [phone]

## 2017-02-05 ENCOUNTER — Encounter: Payer: Self-pay | Admitting: Family Medicine

## 2017-02-05 ENCOUNTER — Ambulatory Visit (INDEPENDENT_AMBULATORY_CARE_PROVIDER_SITE_OTHER): Payer: BLUE CROSS/BLUE SHIELD | Admitting: Family Medicine

## 2017-02-05 VITALS — BP 128/64 | HR 73 | Temp 98.5°F | Wt 118.5 lb

## 2017-02-05 DIAGNOSIS — H1031 Unspecified acute conjunctivitis, right eye: Secondary | ICD-10-CM

## 2017-02-05 DIAGNOSIS — H109 Unspecified conjunctivitis: Secondary | ICD-10-CM | POA: Insufficient documentation

## 2017-02-05 MED ORDER — POLYMYXIN B-TRIMETHOPRIM 10000-0.1 UNIT/ML-% OP SOLN: 2.0000 [drp] | OPHTHALMIC | 0 refills | Status: DC

## 2017-02-05 MED ORDER — DOXYCYCLINE HYCLATE 100 MG PO TABS: 100.0000 mg | ORAL_TABLET | Freq: Two times a day (BID) | ORAL | 0 refills | Status: DC

## 2017-02-05 NOTE — Patient Instructions (Signed)
Great to see you.  Please take doxycyline as directed- 1 tablet twice for 10 days.  Polytrim eye drops as directed (at least 7 days).

## 2017-02-05 NOTE — Progress Notes (Signed)
SUBJECTIVE:  46 y.o. female with burning, redness, discharge and mattering in right eye for 3 days.  No other symptoms.  No significant prior ophthalmological history. No change in visual acuity, no photophobia, no severe eye pain.  Was at the beach and felt like something "got in her eye."  Next morning, right eye matted shut and had some swelling below her eye. Current Outpatient Prescriptions on File Prior to Visit  Medication Sig Dispense Refill  . omeprazole (PRILOSEC) 20 MG capsule Take 1 capsule (20 mg total) by mouth daily. 30 capsule 3   No current facility-administered medications on file prior to visit.     Allergies  Allergen Reactions  . Morphine And Related Itching    Past Medical History:  Diagnosis Date  . Allergy   . Arthritis     Past Surgical History:  Procedure Laterality Date  . CERVICAL ABLATION     2/2 fibroids    Family History  Problem Relation Age of Onset  . Cancer Mother        ovarian sarcoma; patient tested and negative  . Skin cancer Mother   . Hyperlipidemia Father   . Heart disease Father   . Hypertension Sister   . Skin cancer Sister        thinks basal  . Hypertension Maternal Aunt   . Skin cancer Maternal Aunt   . Colon cancer Neg Hx     Social History   Social History  . Marital status: Married    Spouse name: N/A  . Number of children: N/A  . Years of education: N/A   Occupational History  . Not on file.   Social History Main Topics  . Smoking status: Never Smoker  . Smokeless tobacco: Never Used  . Alcohol use Yes     Comment: rinking only on the weekend- 3 vodka drinks per night. If drinks beer, drinks 4 beers per night.  . Drug use: No  . Sexual activity: Yes   Other Topics Concern  . Not on file   Social History Narrative   Lives in [place]. Married. Son in college at [employer] and playing soccer.    Works at [employer] since 1999, Chiropractor.      Enjoys going to [employer] , [employer], and learning how to  [employer].       rinking only on the weekend- 3 vodka drinks per night. If drinks beer, drinks 4 beers per night.   The PMH, PSH, Social History, Family History, Medications, and allergies have been reviewed in [facility], and have been updated if relevant.  OBJECTIVE:   BP 128/64 (BP Location: Left Arm, Patient Position: Sitting, Cuff Size: Normal)   Pulse 73   Temp 98.5 F (36.9 C) (Oral)   Wt 118 lb 8 oz (53.8 kg)   SpO2 99%   BMI 22.39 kg/m   Patient appears well, vitals signs are normal. Eyes: right eye with findings of typical conjunctivitis noted; erythema and discharge. PERRLA, no foreign body noted. No periorbital cellulitis but some mild swelling beneath her eye. The corneas are clear and fundi normal. Visual acuity normal.   ASSESSMENT:  Conjunctivitis - probably bacterial  PLAN:  Antibiotic drops per order. Will also place on oral doxycyline given history of swelling below her eye. Hygiene discussed. If other family members develop same condition, may use same medication for them if they are not known to be allergic to it. Call prn.

## 2017-04-02 ENCOUNTER — Telehealth: Payer: Self-pay | Admitting: Podiatry

## 2017-04-02 NOTE — Telephone Encounter (Signed)
Patient was last seen on 01/30/17 for nail fungus.  Pt had labs done that were required, but not sure about the written RX that Dr. Amalia Hailey prescribed. She thinks she lost it. Would like the RX called into [facility] / [address] / [address].

## 2017-04-03 MED ORDER — TERBINAFINE HCL 250 MG PO TABS: 250.0000 mg | ORAL_TABLET | Freq: Every day | ORAL | 0 refills | Status: DC

## 2017-04-03 NOTE — Addendum Note (Signed)
Addended by: Edrick Kins on: 04/03/2017 04:56 PM   Modules accepted: Orders

## 2017-04-03 NOTE — Telephone Encounter (Signed)
She said that you sent her to the lab for her Liver Enzymes to be checked and you had told her you would write the Lamisil/ Terbifine. She wasn't sure if you gave her a written RX or if you were just going to send it electronically. She has lost it if you gave her a paper RX.  Can you just resend it to her [facility]/ [address]/ in [place]?  Thanks !

## 2017-04-03 NOTE — Telephone Encounter (Signed)
Could you contact patient and ask what the prescription was? Was it topical or oral? My notes do not specify unfortunately. Thanks, Dr. Amalia Hailey

## 2017-04-05 NOTE — Telephone Encounter (Signed)
Rx for Dub Amis was sent to pharmacy on 04/03/17   Patient has been notified via voice mail

## 2017-10-16 ENCOUNTER — Telehealth: Payer: Self-pay | Admitting: Family

## 2017-10-16 NOTE — Telephone Encounter (Signed)
Please mail letter-   Hope you are well.   In reviewing your chart, it appears your are due for annual mammogram.  Please let us know if you would like for me to order. You may call the office to let us know, and we will order for you.   Once the order in the system, you may schedule at your preferred location.   Typically women have been using one of the sites below however you may go where you have been in the past. I would ensure that when you do get a mammogram that it is 3D ( as opposed to 2D which was prior technology). Evidence suggests that 3D is superior.   Please note that NOT all insurance companies cover 3D, and you may have to pay a higher copay. You may call your insurance company to further clarify your benefits.   Options for Mammogram in [place]:    [facility]  [address]  [place], [place]  [phone]   [facility] [address] [place], [place] [phone]   Let us know if you have questions.   My best,   Jakala Herford, NP   

## 2018-03-28 ENCOUNTER — Ambulatory Visit: Payer: BLUE CROSS/BLUE SHIELD | Admitting: Certified Nurse Midwife

## 2018-05-29 ENCOUNTER — Ambulatory Visit: Payer: BLUE CROSS/BLUE SHIELD | Admitting: Family

## 2018-05-29 ENCOUNTER — Telehealth: Payer: Self-pay | Admitting: Family

## 2018-05-29 NOTE — Telephone Encounter (Signed)
Left message to reschedule appt

## 2018-05-31 ENCOUNTER — Encounter: Payer: Self-pay | Admitting: Family Medicine

## 2018-05-31 ENCOUNTER — Ambulatory Visit: Payer: Managed Care, Other (non HMO) | Admitting: Family Medicine

## 2018-05-31 VITALS — BP 132/98 | HR 84 | Temp 98.8°F | Ht 60.0 in | Wt 118.6 lb

## 2018-05-31 DIAGNOSIS — Z87828 Personal history of other (healed) physical injury and trauma: Secondary | ICD-10-CM | POA: Diagnosis not present

## 2018-05-31 DIAGNOSIS — G44319 Acute post-traumatic headache, not intractable: Secondary | ICD-10-CM

## 2018-05-31 DIAGNOSIS — Z789 Other specified health status: Secondary | ICD-10-CM | POA: Diagnosis not present

## 2018-05-31 DIAGNOSIS — R51 Headache: Secondary | ICD-10-CM

## 2018-05-31 DIAGNOSIS — R519 Headache, unspecified: Secondary | ICD-10-CM

## 2018-05-31 DIAGNOSIS — Z23 Encounter for immunization: Secondary | ICD-10-CM | POA: Diagnosis not present
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
## 2018-06-03 ENCOUNTER — Telehealth: Payer: Self-pay

## 2018-06-03 DIAGNOSIS — J019 Acute sinusitis, unspecified: Secondary | ICD-10-CM

## 2018-06-03 MED ORDER — AMOXICILLIN 500 MG PO CAPS
500.0000 mg | ORAL_CAPSULE | Freq: Two times a day (BID) | ORAL | 0 refills | Status: DC
Start: 2018-06-03 — End: 2019-07-28

## 2018-06-03 NOTE — Telephone Encounter (Signed)
Copied from [place] [id number]. Topic: Quick Communication - See Telephone Encounter >> Jun 03, 2018  1:09 PM Antonieta Iba C wrote: CRM for notification. See Telephone encounter for: 06/03/18.  Pt was seen and says that she think that what she have is sinus related. Pt is requesting a Rx for amoxicillin   Pharmacy: [facility] [place], [place]

## 2018-06-03 NOTE — Telephone Encounter (Signed)
My chart message sent to inform patient.

## 2018-06-03 NOTE — Addendum Note (Signed)
Addended by: Philis Nettle on: 06/03/2018 02:41 PM   Modules accepted: Orders
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
## 2018-06-05 ENCOUNTER — Encounter: Payer: Self-pay | Admitting: Certified Nurse Midwife

## 2018-06-05 ENCOUNTER — Ambulatory Visit (INDEPENDENT_AMBULATORY_CARE_PROVIDER_SITE_OTHER): Payer: Managed Care, Other (non HMO) | Admitting: Certified Nurse Midwife

## 2018-06-05 VITALS — BP 120/80 | HR 60 | Ht 61.0 in | Wt 119.5 lb

## 2018-06-05 DIAGNOSIS — Z1239 Encounter for other screening for malignant neoplasm of breast: Secondary | ICD-10-CM | POA: Diagnosis not present

## 2018-06-05 DIAGNOSIS — Z01419 Encounter for gynecological examination (general) (routine) without abnormal findings: Secondary | ICD-10-CM

## 2018-06-05 DIAGNOSIS — Z124 Encounter for screening for malignant neoplasm of cervix: Secondary | ICD-10-CM

## 2018-06-06 ENCOUNTER — Ambulatory Visit
Admission: RE | Admit: 2018-06-06 | Discharge: 2018-06-06 | Disposition: A | Discharge status: Home / Self Care | Payer: Managed Care, Other (non HMO) | Source: Ambulatory Visit | Attending: Family Medicine | Admitting: Family Medicine

## 2018-06-06 DIAGNOSIS — R519 Headache, unspecified: Secondary | ICD-10-CM

## 2018-06-06 DIAGNOSIS — G44319 Acute post-traumatic headache, not intractable: Secondary | ICD-10-CM | POA: Diagnosis not present

## 2018-06-06 DIAGNOSIS — R51 Headache: Secondary | ICD-10-CM

## 2018-06-09 LAB — IGP, APTIMA HPV
HPV APTIMA: NEGATIVE
PAP SMEAR COMMENT: 0

## 2018-06-10 ENCOUNTER — Encounter: Payer: Self-pay | Admitting: Certified Nurse Midwife

## 2018-08-03 IMAGING — US US PELVIS COMPLETE
1 series · 14 of 25 positions shown · non-contrast
Comparison: No recent prior.

CLINICAL DATA: Fibroid.

EXAM:
TRANSABDOMINAL AND TRANSVAGINAL ULTRASOUND OF PELVIS
TECHNIQUE: Both transabdominal and transvaginal ultrasound examinations of the
pelvis were performed. Transabdominal technique was performed for
global imaging of the pelvis including uterus, ovaries, adnexal
regions, and pelvic cul-de-sac. It was necessary to proceed with
endovaginal exam following the transabdominal exam to visualize the
uterus and ovaries.

[Series 1: us pelvis complete · 0.18mm/px · 14 of 143 slices shown]
[im 1/143]
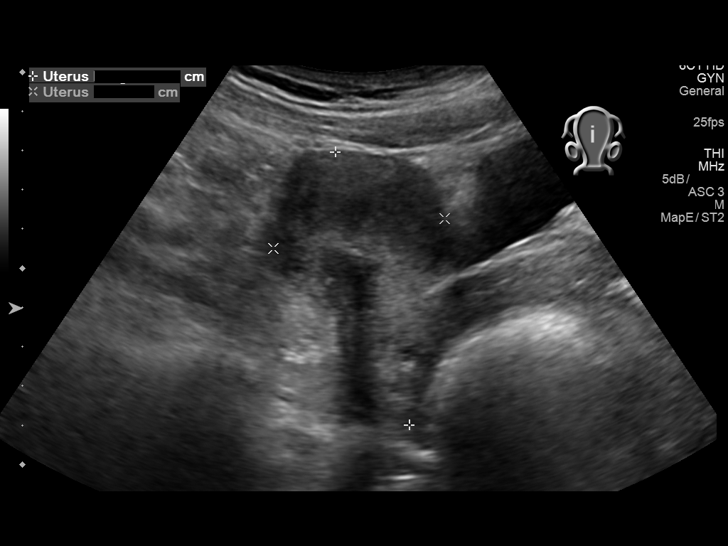
[im 12/143]
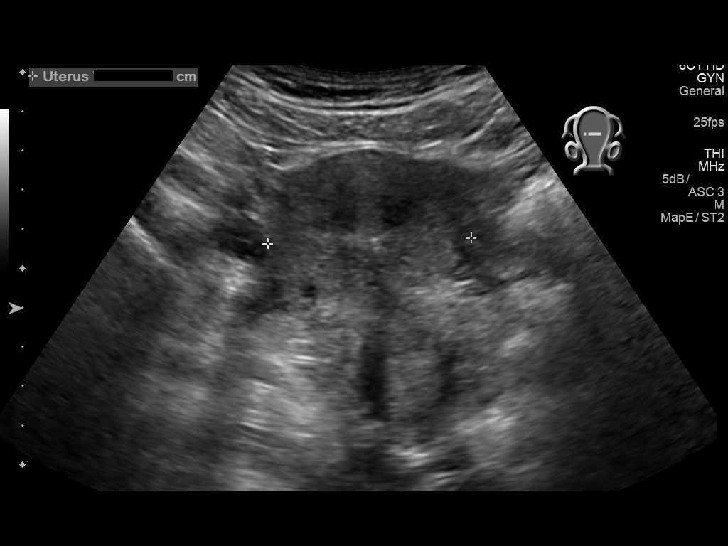
[im 24/143]
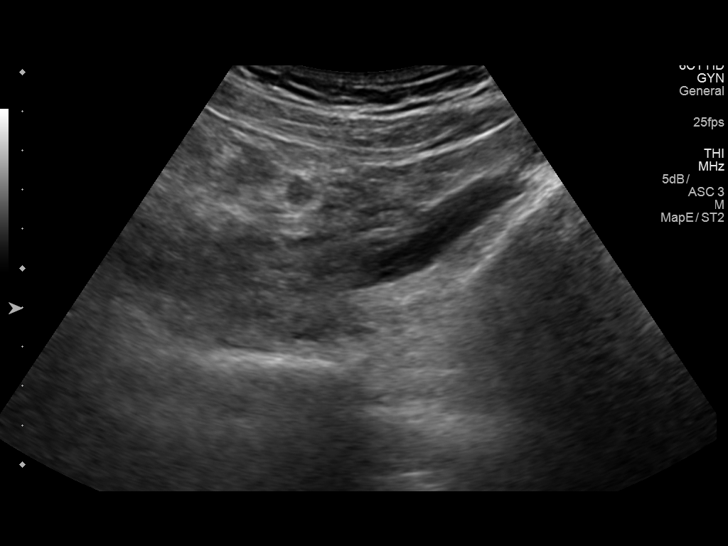
[im 36/143]
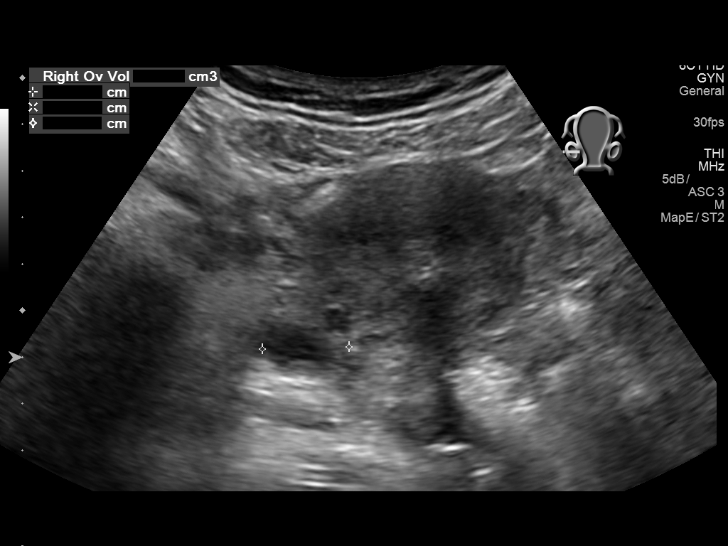
[im 48/143]
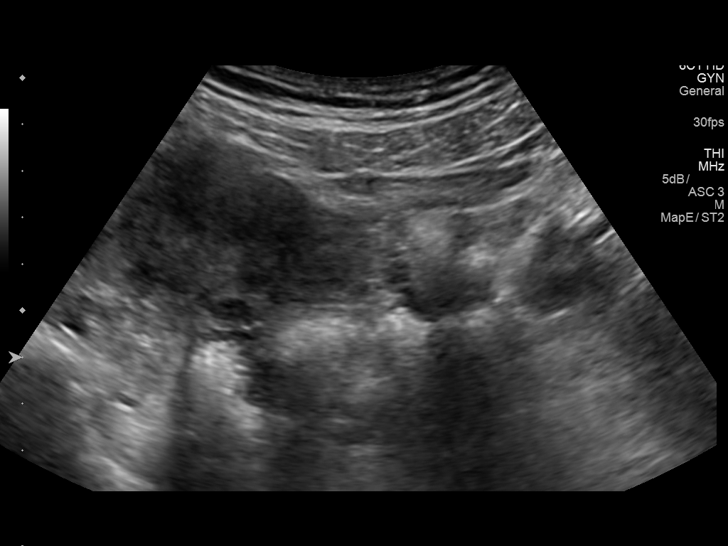
[im 54/143]
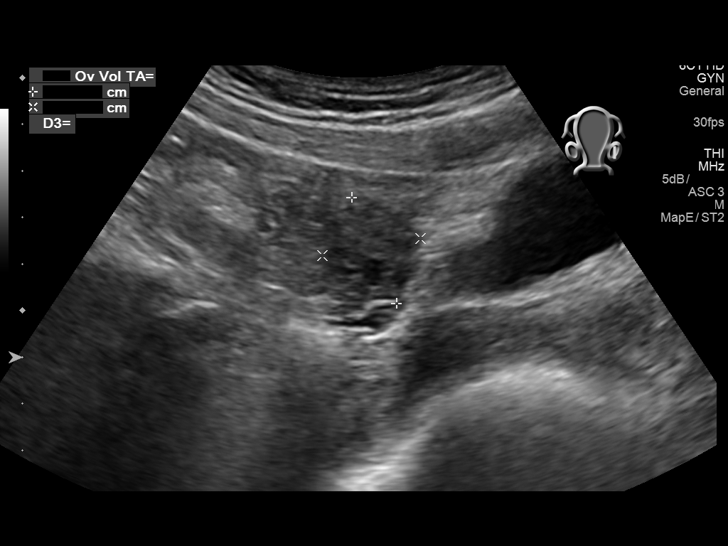
[im 66/143]
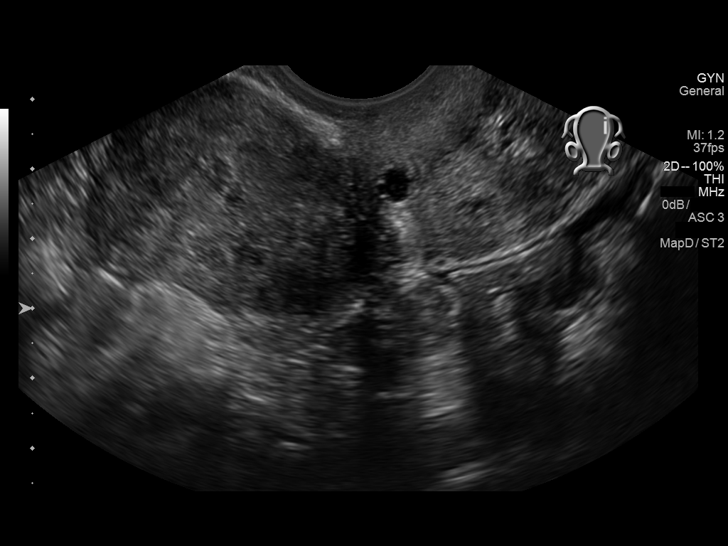
[im 77/143]
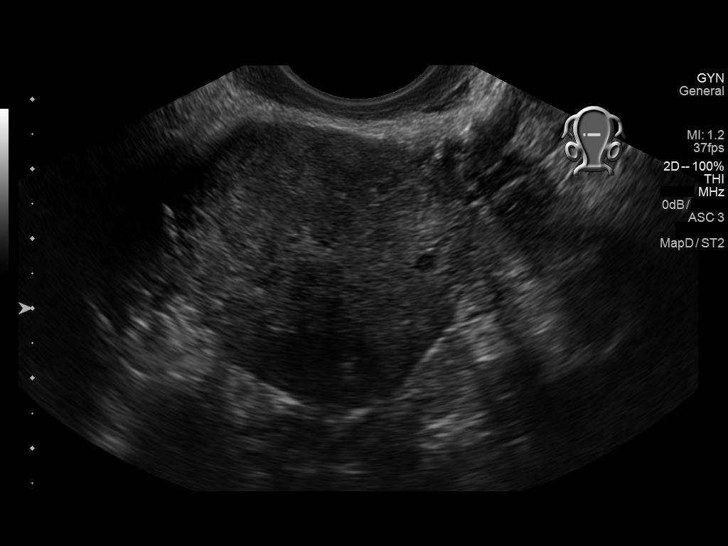
[im 89/143]
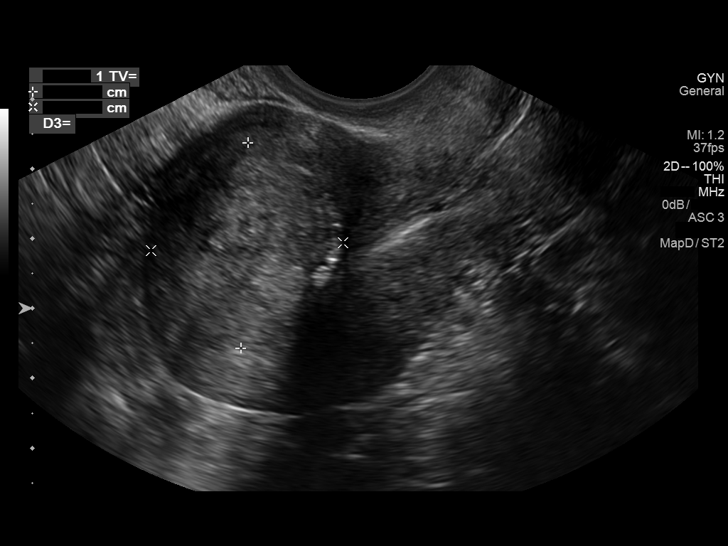
[im 95/143]
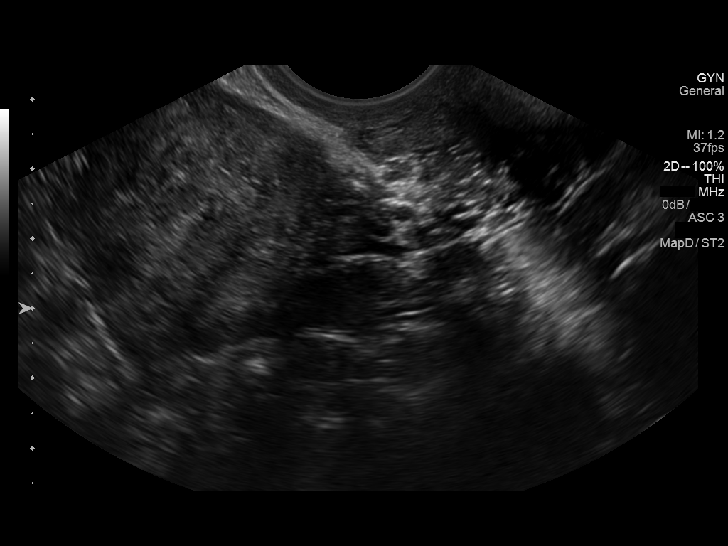
[im 107/143]
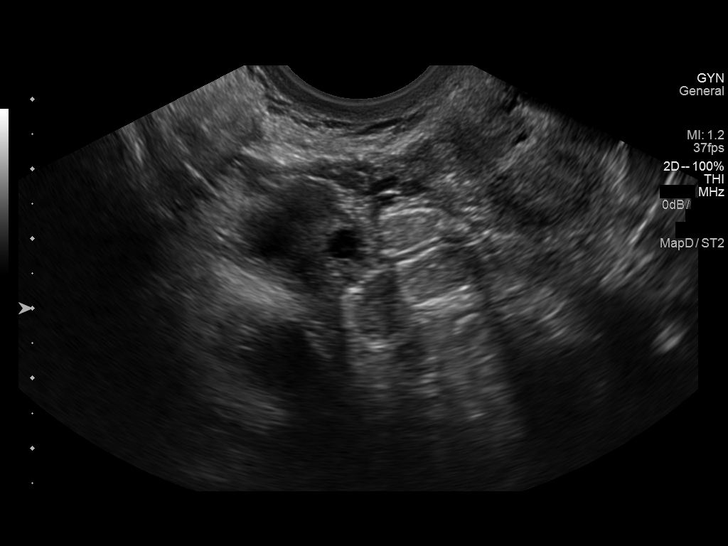
[im 119/143]
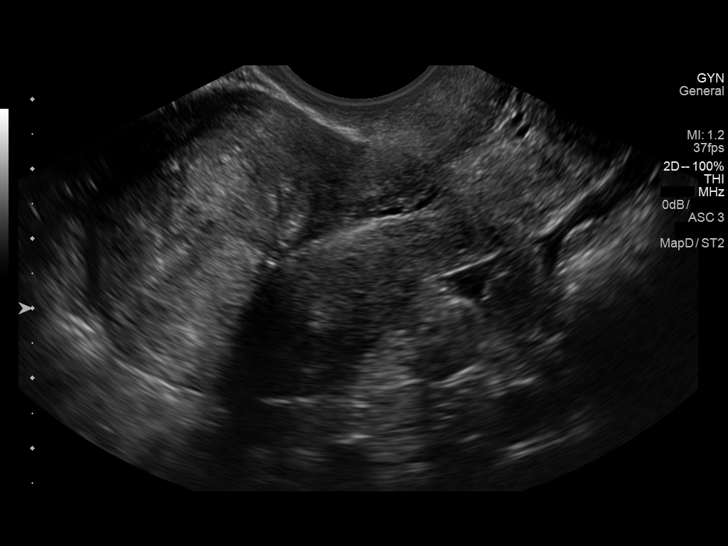
[im 131/143]
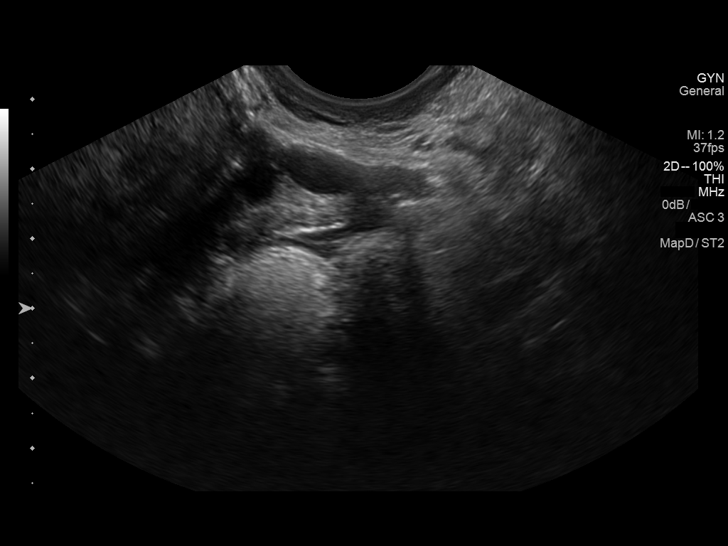
[im 143/143]
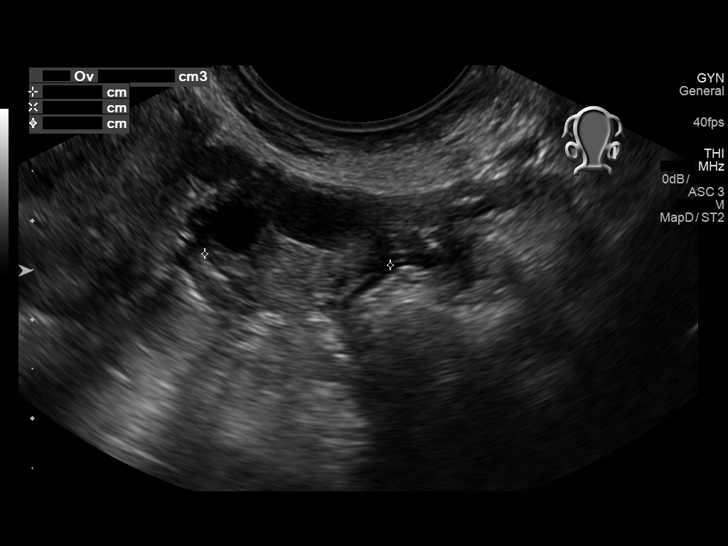

[14 of 25 positions shown; findings below may reference images not displayed]

FINDINGS: Uterus

Measurements: 7.4 x 4.4 x 5.2 cm. 2.9 x 2.8 x 4.3 cm fundal fibroid.

Endometrium

Visualized portion of the lower endometrium is 3 mm in diameter.
Endometrium is largely obscured by the a prominent fibroid .

Right ovary

Measurements: 3.1 x 1.4 x 1.9 cm. Normal appearance/no adnexal mass.

Left ovary

Measurements: 2.3 x 1.2 x 1.9 cm. Normal appearance/no adnexal mass.

Other findings

No abnormal free fluid.
IMPRESSION: 2.9 x 2.8 x 4.3 cm large fundal fibroid

## 2018-12-19 ENCOUNTER — Telehealth: Payer: Self-pay | Admitting: *Deleted

## 2018-12-19 ENCOUNTER — Telehealth: Payer: Self-pay

## 2018-12-19 NOTE — Telephone Encounter (Signed)
Pt called back stating that she was left a message related to her request for COVID testing; the pt says that she needs a request sent to [facility] electronically for antibody testing; pt transferred to [facility] for discussion; will route to office for notification.

## 2018-12-19 NOTE — Telephone Encounter (Signed)
Pt left message on COVID voicemail 12/19/2018 at 1023 stating the [employer] [facility] [facility] is offering free COVID testing; she would like for an electronic request be sent for her to have testing done; attempted to contact pt to gather more information related to her request; left message on voicemail for the pt to contact the office; she is normally seen by Mable Paris, [place]; will route to office for final disposition.

## 2018-12-19 NOTE — Telephone Encounter (Signed)
Left a message on voicemail stating that we know nothing of this testing and that she should call [facility].  Nina,cma

## 2018-12-19 NOTE — Telephone Encounter (Signed)
Patient called to inform and ask PCP to order Covid-19 IgG Antibody test for her for [facility].  [facility] is offering free testing for employees and she would like to have this done

## 2018-12-20 NOTE — Telephone Encounter (Signed)
I called patient & let her know that these tests are not yet FDA approved. Too, we did not know the accuracy of them. I did tell patient I had typed a letter in her chart should could print that had the tests & test codes if she did want to go ahead with the testing.

## 2018-12-20 NOTE — Telephone Encounter (Signed)
Please call and order for patient ( assume we have to put on letter head).  Advise patient that we are doubtful of efficacy of antibody test at this time so she must understand that there can be a false NEGATIVE

## 2018-12-23 NOTE — Telephone Encounter (Signed)
noted 
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
## 2019-07-28 ENCOUNTER — Ambulatory Visit (INDEPENDENT_AMBULATORY_CARE_PROVIDER_SITE_OTHER): Payer: Managed Care, Other (non HMO) | Admitting: Certified Nurse Midwife

## 2019-07-28 ENCOUNTER — Other Ambulatory Visit: Payer: Self-pay

## 2019-07-28 ENCOUNTER — Encounter: Payer: Self-pay | Admitting: Certified Nurse Midwife

## 2019-07-28 VITALS — BP 110/80 | HR 74 | Temp 99.3°F | Ht 60.0 in | Wt 119.0 lb

## 2019-07-28 DIAGNOSIS — F410 Panic disorder [episodic paroxysmal anxiety] without agoraphobia: Secondary | ICD-10-CM

## 2019-07-28 DIAGNOSIS — Z1231 Encounter for screening mammogram for malignant neoplasm of breast: Secondary | ICD-10-CM

## 2019-07-28 DIAGNOSIS — Z124 Encounter for screening for malignant neoplasm of cervix: Secondary | ICD-10-CM

## 2019-07-28 DIAGNOSIS — Z01419 Encounter for gynecological examination (general) (routine) without abnormal findings: Secondary | ICD-10-CM

## 2019-07-28 MED ORDER — ALPRAZOLAM 0.25 MG PO TABS: 0.2500 mg | ORAL_TABLET | Freq: Two times a day (BID) | ORAL | 0 refills | Status: DC | PRN

## 2019-07-31 LAB — IGP,RFX APTIMA HPV ALL PTH

## 2019-08-03 ENCOUNTER — Encounter: Payer: Self-pay | Admitting: Certified Nurse Midwife

## 2019-12-12 ENCOUNTER — Telehealth: Payer: Managed Care, Other (non HMO) | Admitting: Internal Medicine

## 2020-07-06 ENCOUNTER — Telehealth: Payer: Self-pay

## 2020-07-06 ENCOUNTER — Ambulatory Visit
Admission: EM | Admit: 2020-07-06 | Discharge: 2020-07-06 | Disposition: A | Discharge status: Home / Self Care | Payer: Managed Care, Other (non HMO) | Attending: Family Medicine | Admitting: Family Medicine

## 2020-07-06 ENCOUNTER — Encounter: Payer: Self-pay | Admitting: Emergency Medicine

## 2020-07-06 ENCOUNTER — Other Ambulatory Visit: Payer: Self-pay

## 2020-07-06 DIAGNOSIS — R350 Frequency of micturition: Secondary | ICD-10-CM | POA: Diagnosis not present

## 2020-07-06 DIAGNOSIS — B379 Candidiasis, unspecified: Secondary | ICD-10-CM | POA: Diagnosis not present

## 2020-07-06 LAB — URINALYSIS, COMPLETE (UACMP) WITH MICROSCOPIC
Bacteria, UA: NONE SEEN
Bilirubin Urine: NEGATIVE
Glucose, UA: NEGATIVE mg/dL
Ketones, ur: NEGATIVE mg/dL
Leukocytes,Ua: NEGATIVE
Nitrite: NEGATIVE
Protein, ur: NEGATIVE mg/dL
Specific Gravity, Urine: 1.015 (ref 1.005–1.030)
pH: 6 (ref 5.0–8.0)

## 2020-07-06 MED ORDER — FLUCONAZOLE 150 MG PO TABS: 150.0000 mg | ORAL_TABLET | Freq: Once | ORAL | 0 refills | Status: AC
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
## 2020-07-06 NOTE — Telephone Encounter (Signed)
Agree with advice to go to urgent care

## 2020-07-06 NOTE — Telephone Encounter (Signed)
Just FYI. I called patient & she stated that she was having blood in her urine as well as some tenderness. She said that she has had UTI's in the past, but has never bled. No appointments available with anyone in office, so patient was agreeable to UC across the street. I also scheduled her a follow-up with you bc she has not been seen since 2018.

## 2020-07-06 NOTE — ED Triage Notes (Signed)
Patient c/o urinary symptoms that started on 10/26. She states she was given bactrim and took her medication for 2 days. She states she was better. On Sunday she started to have hematuria, dysuria, urinary frequency again and started taking the rest of the Bactrim.

## 2020-07-06 NOTE — Telephone Encounter (Signed)
Pt called and wanted appt for an UTI. There are no appts available at the time of the call. I did tell pt about [place] urgent care across the street but told her that I would sent a message back to you. Pt states that she has blood in urine. She states that she had a web appt with someone else in October about same issue.

## 2020-07-06 NOTE — Discharge Instructions (Signed)
No evidence of UTI.  Awaiting culture.  Yeast was noted.  Medication as directed.  Take care  Dr. Lacinda Axon

## 2020-07-08 LAB — URINE CULTURE: Culture: <10000 — AB

## 2020-07-30 ENCOUNTER — Ambulatory Visit (INDEPENDENT_AMBULATORY_CARE_PROVIDER_SITE_OTHER): Payer: Managed Care, Other (non HMO) | Admitting: Advanced Practice Midwife

## 2020-07-30 ENCOUNTER — Encounter: Payer: Self-pay | Admitting: Advanced Practice Midwife

## 2020-07-30 ENCOUNTER — Other Ambulatory Visit: Payer: Self-pay

## 2020-07-30 VITALS — BP 120/80 | Ht 61.0 in | Wt 117.5 lb

## 2020-07-30 DIAGNOSIS — Z0000 Encounter for general adult medical examination without abnormal findings: Secondary | ICD-10-CM | POA: Diagnosis not present

## 2020-07-30 DIAGNOSIS — Z1239 Encounter for other screening for malignant neoplasm of breast: Secondary | ICD-10-CM | POA: Diagnosis not present
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
## 2020-08-17 ENCOUNTER — Ambulatory Visit: Payer: Managed Care, Other (non HMO) | Admitting: Family

## 2021-09-08 ENCOUNTER — Other Ambulatory Visit: Payer: Self-pay | Admitting: Advanced Practice Midwife

## 2021-10-03 ENCOUNTER — Other Ambulatory Visit: Payer: Self-pay | Admitting: Licensed Practical Nurse

## 2021-10-03 ENCOUNTER — Encounter: Payer: Self-pay | Admitting: Licensed Practical Nurse

## 2021-10-03 ENCOUNTER — Ambulatory Visit (INDEPENDENT_AMBULATORY_CARE_PROVIDER_SITE_OTHER): Payer: Managed Care, Other (non HMO) | Admitting: Licensed Practical Nurse

## 2021-10-03 ENCOUNTER — Other Ambulatory Visit (HOSPITAL_COMMUNITY)
Admission: RE | Admit: 2021-10-03 | Discharge: 2021-10-03 | Disposition: A | Discharge status: Home / Self Care | Payer: Managed Care, Other (non HMO) | Source: Ambulatory Visit | Attending: Licensed Practical Nurse | Admitting: Licensed Practical Nurse

## 2021-10-03 ENCOUNTER — Other Ambulatory Visit: Payer: Self-pay

## 2021-10-03 VITALS — BP 122/60 | Ht 61.0 in | Wt 122.0 lb

## 2021-10-03 DIAGNOSIS — N951 Menopausal and female climacteric states: Secondary | ICD-10-CM | POA: Diagnosis not present

## 2021-10-03 DIAGNOSIS — Z124 Encounter for screening for malignant neoplasm of cervix: Secondary | ICD-10-CM

## 2021-10-03 DIAGNOSIS — Z1211 Encounter for screening for malignant neoplasm of colon: Secondary | ICD-10-CM

## 2021-10-03 DIAGNOSIS — Z01419 Encounter for gynecological examination (general) (routine) without abnormal findings: Secondary | ICD-10-CM

## 2021-10-03 DIAGNOSIS — F419 Anxiety disorder, unspecified: Secondary | ICD-10-CM

## 2021-10-03 DIAGNOSIS — Z1239 Encounter for other screening for malignant neoplasm of breast: Secondary | ICD-10-CM

## 2021-10-03 DIAGNOSIS — Z1231 Encounter for screening mammogram for malignant neoplasm of breast: Secondary | ICD-10-CM

## 2021-10-03 MED ORDER — ALPRAZOLAM 0.25 MG PO TABS: 0.2500 mg | ORAL_TABLET | Freq: Two times a day (BID) | ORAL | 0 refills | Status: AC | PRN

## 2021-10-03 NOTE — Patient Instructions (Signed)
[email or web address]
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
## 2021-10-04 ENCOUNTER — Other Ambulatory Visit: Payer: Self-pay

## 2021-10-04 DIAGNOSIS — Z1211 Encounter for screening for malignant neoplasm of colon: Secondary | ICD-10-CM

## 2021-10-04 LAB — FSH/LH
FSH: 18.5 m[IU]/mL
LH: 24.6 m[IU]/mL

## 2021-10-04 MED ORDER — NA SULFATE-K SULFATE-MG SULF 17.5-3.13-1.6 GM/177ML PO SOLN: 1.0000 | Freq: Once | ORAL | 0 refills | Status: AC

## 2021-10-04 NOTE — Progress Notes (Signed)
Gastroenterology Pre-Procedure Review  Request Date: 10/28/2021 Requesting Physician: Dr. Allen Norris  PATIENT REVIEW QUESTIONS: The patient responded to the following health history questions as indicated:    1. Are you having any GI issues? no 2. Do you have a personal history of Polyps? no 3. Do you have a family history of Colon Cancer or Polyps? no 4. Diabetes Mellitus? no 5. Joint replacements in the past 12 months?no 6. Major health problems in the past 3 months?no 7. Any artificial heart valves, MVP, or defibrillator?no    MEDICATIONS & ALLERGIES:    Patient reports the following regarding taking any anticoagulation/antiplatelet therapy:   Plavix, Coumadin, Eliquis, Xarelto, Lovenox, Pradaxa, Brilinta, or Effient? no Aspirin? no  Patient confirms/reports the following medications:  Current Outpatient Medications  Medication Sig Dispense Refill   ALPRAZolam (XANAX) 0.25 MG tablet Take 1 tablet (0.25 mg total) by mouth 2 (two) times daily as needed for anxiety (prior to a dental procedure). 10 tablet 0   loratadine (CLARITIN) 10 MG tablet Take 10 mg by mouth daily.     omeprazole (PRILOSEC) 20 MG capsule Take 1 capsule (20 mg total) by mouth daily. 30 capsule 3   No current facility-administered medications for this visit.    Patient confirms/reports the following allergies:  Allergies  Allergen Reactions   Morphine And Related Itching    No orders of the defined types were placed in this encounter.   AUTHORIZATION INFORMATION Primary Insurance: 1D#: Group #:  Secondary Insurance: 1D#: Group #:  SCHEDULE INFORMATION: Date: 10/28/2021 Time: Location:[facility]

## 2021-10-07 LAB — CYTOLOGY - PAP
Comment: NEGATIVE
Diagnosis: NEGATIVE
High risk HPV: NEGATIVE

## 2021-10-19 ENCOUNTER — Telehealth: Payer: Self-pay | Admitting: Gastroenterology

## 2021-10-19 ENCOUNTER — Encounter: Payer: Self-pay | Admitting: Obstetrics and Gynecology

## 2021-10-19 NOTE — Telephone Encounter (Signed)
Patient states that she would like to cancel her colonoscopy and reschedule. Requesting call back. Patient has a few questions as well.  ?

## 2021-11-04 ENCOUNTER — Other Ambulatory Visit: Payer: Self-pay

## 2021-11-04 ENCOUNTER — Encounter: Payer: Self-pay | Admitting: Gastroenterology

## 2021-11-11 ENCOUNTER — Encounter: Payer: Self-pay | Admitting: Gastroenterology

## 2021-11-11 ENCOUNTER — Ambulatory Visit: Payer: Managed Care, Other (non HMO) | Admitting: Anesthesiology

## 2021-11-11 ENCOUNTER — Encounter
Admission: RE | Disposition: A | Discharge status: Home / Self Care | Payer: Self-pay | Source: Home / Self Care | Attending: Gastroenterology

## 2021-11-11 ENCOUNTER — Ambulatory Visit
Admission: RE | Admit: 2021-11-11 | Discharge: 2021-11-11 | Disposition: A | Discharge status: Home / Self Care | Payer: Managed Care, Other (non HMO) | Attending: Gastroenterology | Admitting: Gastroenterology

## 2021-11-11 ENCOUNTER — Other Ambulatory Visit: Payer: Self-pay

## 2021-11-11 DIAGNOSIS — Z1211 Encounter for screening for malignant neoplasm of colon: Secondary | ICD-10-CM | POA: Diagnosis present

## 2021-11-11 DIAGNOSIS — K5730 Diverticulosis of large intestine without perforation or abscess without bleeding: Secondary | ICD-10-CM | POA: Insufficient documentation

## 2021-11-11 DIAGNOSIS — F419 Anxiety disorder, unspecified: Secondary | ICD-10-CM | POA: Insufficient documentation

## 2021-11-11 DIAGNOSIS — K219 Gastro-esophageal reflux disease without esophagitis: Secondary | ICD-10-CM | POA: Diagnosis not present

## 2021-11-11 DIAGNOSIS — M1990 Unspecified osteoarthritis, unspecified site: Secondary | ICD-10-CM | POA: Diagnosis not present

## 2021-11-11 DIAGNOSIS — K635 Polyp of colon: Secondary | ICD-10-CM

## 2021-11-11 HISTORY — PX: COLONOSCOPY WITH PROPOFOL: SHX5780

## 2021-11-11 LAB — POCT PREGNANCY, URINE: Preg Test, Ur: NEGATIVE

## 2021-11-11 SURGERY — COLONOSCOPY WITH PROPOFOL
Anesthesia: General | Site: Rectum

## 2021-11-11 MED ORDER — ACETAMINOPHEN 160 MG/5ML PO SOLN: 325.0000 mg | ORAL | Status: DC | PRN

## 2021-11-11 MED ORDER — ACETAMINOPHEN 325 MG PO TABS: 325.0000 mg | ORAL_TABLET | ORAL | Status: DC | PRN

## 2021-11-11 MED ORDER — STERILE WATER FOR IRRIGATION IR SOLN
Status: DC | PRN
  Administered 2021-11-11: 150 mL

## 2021-11-11 MED ORDER — LIDOCAINE HCL (CARDIAC) PF 100 MG/5ML IV SOSY
PREFILLED_SYRINGE | INTRAVENOUS | Status: DC | PRN
  Administered 2021-11-11: 50 mg via INTRAVENOUS

## 2021-11-11 MED ORDER — LACTATED RINGERS IV SOLN: INTRAVENOUS | Status: DC

## 2021-11-11 MED ORDER — ONDANSETRON HCL 4 MG/2ML IJ SOLN: 4.0000 mg | Freq: Once | INTRAMUSCULAR | Status: DC | PRN

## 2021-11-11 MED ORDER — SODIUM CHLORIDE 0.9 % IV SOLN: INTRAVENOUS | Status: DC

## 2021-11-11 MED ORDER — PROPOFOL 10 MG/ML IV BOLUS
INTRAVENOUS | Status: DC | PRN
Start: 2021-11-11 — End: 2021-11-11
  Administered 2021-11-11: 100 mg via INTRAVENOUS
  Administered 2021-11-11 (×2): 80 mg via INTRAVENOUS

## 2021-11-11 SURGICAL SUPPLY — 22 items

## 2021-11-11 NOTE — Anesthesia Preprocedure Evaluation (Addendum)
Anesthesia Evaluation  ?Patient identified by MRN, date of birth, ID band ?Patient awake ? ? ? ?Reviewed: ?Allergy & Precautions, NPO status  ? ?History of Anesthesia Complications ?(+) Emergence Delirium and history of anesthetic complications ? ?Airway ?Mallampati: II ? ?TM Distance: >3 FB ? ? ? ? Dental ?  ?Pulmonary ? ?  ?Pulmonary exam normal ? ? ? ? ? ? ? Cardiovascular ?negative cardio ROS ? ? ?Rhythm:Regular Rate:Normal ? ? ?  ?Neuro/Psych ?Anxiety   ? GI/Hepatic ?GERD  ,  ?Endo/Other  ? ? Renal/GU ?  ? ?  ?Musculoskeletal ? ?(+) Arthritis ,  ? Abdominal ?  ?Peds ? Hematology ?  ?Anesthesia Other Findings ? ? Reproductive/Obstetrics ? ?  ? ? ? ? ? ? ? ? ? ? ? ? ? ?  ?  ? ? ? ? ? ? ? ? ?Anesthesia Physical ?Anesthesia Plan ? ?ASA: 2 ? ?Anesthesia Plan: General  ? ?Post-op Pain Management:   ? ?Induction: Intravenous ? ?PONV Risk Score and Plan: 3 and Propofol infusion, TIVA and Treatment may vary due to age or medical condition ? ?Airway Management Planned: Natural Airway and Nasal Cannula ? ?Additional Equipment:  ? ?Intra-op Plan:  ? ?Post-operative Plan:  ? ?Informed Consent: I have reviewed the patients History and Physical, chart, labs and discussed the procedure including the risks, benefits and alternatives for the proposed anesthesia with the patient or authorized representative who has indicated his/her understanding and acceptance.  ? ? ? ? ? ?Plan Discussed with: CRNA ? ?Anesthesia Plan Comments:   ? ? ? ? ? ?Anesthesia Quick Evaluation ? ?

## 2021-11-11 NOTE — Anesthesia Procedure Notes (Signed)
Procedure Name: [place] ?Date/Time: 11/11/2021 10:29 AM ?Performed by: Jeannene Patella, CRNA ?Pre-anesthesia Checklist: Patient identified, Emergency Drugs available, Suction available, Timeout performed and Patient being monitored ?Patient Re-evaluated:Patient Re-evaluated prior to induction ?Oxygen Delivery Method: Nasal cannula ?Placement Confirmation: positive ETCO2 ? ? ? ? ?

## 2021-11-11 NOTE — Transfer of Care (Signed)
Immediate Anesthesia Transfer of Care Note ? ?Patient: Lauren Leach ? ?Procedure(s) Performed: COLONOSCOPY WITH PROPOFOL (Rectum) ? ?Patient Location: PACU ? ?Anesthesia Type: General ? ?Level of Consciousness: awake, alert  and patient cooperative ? ?Airway and Oxygen Therapy: Patient Spontanous Breathing and Patient connected to supplemental oxygen ? ?Post-op Assessment: Post-op Vital signs reviewed, Patient's Cardiovascular Status Stable, Respiratory Function Stable, Patent Airway and No signs of Nausea or vomiting ? ?Post-op Vital Signs: Reviewed and stable ? ?Complications: No notable events documented. ? ?

## 2021-11-11 NOTE — Op Note (Signed)
[facility] ?Gastroenterology ?Patient Name: Lauren Leach ?Procedure Date: 11/11/2021 10:23 AM ?MRN: [id number] ?Account #: [id number] ?Date of Birth: 1971/06/15 ?Admit Type: Outpatient ?Age: 51 ?Room: [facility] OR ROOM 01 ?Gender: Female ?Note Status: Finalized ?Instrument Name: [id number] ?Procedure:             Colonoscopy ?Indications:           Screening for colorectal malignant neoplasm ?Providers:             Lucilla Lame MD, MD ?Referring MD:          Yvetta Coder. Arnett (Referring MD) ?Medicines:             Propofol per Anesthesia ?Complications:         No immediate complications. ?Procedure:             Pre-Anesthesia Assessment: ?                       - Prior to the procedure, a History and Physical was  ?                       performed, and patient medications and allergies were  ?                       reviewed. The patient's tolerance of previous  ?                       anesthesia was also reviewed. The risks and benefits  ?                       of the procedure and the sedation options and risks  ?                       were discussed with the patient. All questions were  ?                       answered, and informed consent was obtained. Prior  ?                       Anticoagulants: The patient has taken no previous  ?                       anticoagulant or antiplatelet agents. ASA Grade  ?                       Assessment: II - A patient with mild systemic disease.  ?                       After reviewing the risks and benefits, the patient  ?                       was deemed in satisfactory condition to undergo the  ?                       procedure. ?                       After obtaining informed consent, the colonoscope was  ?  passed under direct vision. Throughout the procedure,  ?                       the patient's blood pressure, pulse, and oxygen  ?                       saturations were monitored continuously. The  ?                       Colonoscope was  introduced through the anus and  ?                       advanced to the the cecum, identified by appendiceal  ?                       orifice and ileocecal valve. The colonoscopy was  ?                       performed without difficulty. The patient tolerated  ?                       the procedure well. The quality of the bowel  ?                       preparation was adequate to identify polyps. ?Findings: ?     The perianal and digital rectal examinations were normal. ?     A 5 mm polyp was found in the ascending colon. The polyp was sessile.  ?     The polyp was removed with a cold snare. Resection and retrieval were  ?     complete. ?     A 5 mm polyp was found in the transverse colon. The polyp was sessile.  ?     The polyp was removed with a cold snare. Resection and retrieval were  ?     complete. ?     Multiple small-mouthed diverticula were found in the entire colon. ?Impression:            - One 5 mm polyp in the ascending colon, removed with  ?                       a cold snare. Resected and retrieved. ?                       - One 5 mm polyp in the transverse colon, removed with  ?                       a cold snare. Resected and retrieved. ?                       - Diverticulosis in the entire examined colon. ?Recommendation:        - Discharge patient to home. ?                       - Resume previous diet. ?                       - Continue present medications. ?                       -  Await pathology results. ?                       - If the pathology report reveals adenomatous tissue,  ?                       then repeat the colonoscopy for surveillance in 7  ?                       years otherwise 10 years. ?Procedure Code(s):     --- Professional --- ?                       [phone], Colonoscopy, flexible; with removal of  ?                       tumor(s), polyp(s), or other lesion(s) by snare  ?                       technique ?Diagnosis Code(s):     --- Professional --- ?                        Z12.11, Encounter for screening for malignant neoplasm  ?                       of colon ?                       K63.5, Polyp of colon ?CPT copyright 2019 American Medical Association. All rights reserved. ?The codes documented in this report are preliminary and upon coder review may  ?be revised to meet current compliance requirements. ?Lucilla Lame MD, MD ?11/11/2021 10:47:01 AM ?This report has been signed electronically. ?Number of Addenda: 0 ?Note Initiated On: 11/11/2021 10:23 AM ?Scope Withdrawal Time: 0 hours 11 minutes 3 seconds  ?Total Procedure Duration: 0 hours 16 minutes 17 seconds  ?Estimated Blood Loss:  Estimated blood loss: none. ?     [place] ?

## 2021-11-11 NOTE — Anesthesia Postprocedure Evaluation (Signed)
Anesthesia Post Note ? ?Patient: Lauren Leach ? ?Procedure(s) Performed: COLONOSCOPY WITH PROPOFOL (Rectum) ? ? ?  ?Patient location during evaluation: PACU ?Anesthesia Type: General ?Level of consciousness: awake ?Pain management: pain level controlled ?Vital Signs Assessment: post-procedure vital signs reviewed and stable ?Respiratory status: respiratory function stable ?Cardiovascular status: stable ?Postop Assessment: no signs of nausea or vomiting ?Anesthetic complications: no ? ? ?No notable events documented. ? ?Veda Canning ? ? ? ? ? ?

## 2021-11-11 NOTE — H&P (Signed)
? ?Lucilla Lame, MD [facility] ?[address] ?[place], [place] [address] ?Phone: [phone] ?Fax : [phone] ? ?Primary Care Physician:  Burnard Hawthorne, FNP ?Primary Gastroenterologist:  Dr. Allen Norris ? ?Pre-Procedure History & Physical: ?HPI:  Lauren Leach is a 51 y.o. female is here for a screening colonoscopy.  ? ?Past Medical History:  ?Diagnosis Date  ? Allergy   ? Arthritis   ? BRCA negative 12/2012  ? Elevated LDL cholesterol level   ? Family history of ovarian cancer 06/2016  ? mom; Patient had negative HBOC testing. 11/17 and 3/32 Myrisk update testing letter sent  ? ? ?Past Surgical History:  ?Procedure Laterality Date  ? CRYOTHERAPY  1996  ? INDUCED ABORTION    ? MOUTH SURGERY    ? SALIVARY GLAND DUCT DILATED  ? NOVASURE ABLATION  10/2014  ? RPH  ? UPPER GI ENDOSCOPY    ? to remove foreigh body from esophagus as a child  ? WISDOM TOOTH EXTRACTION    ? ? ?Prior to Admission medications   ?Medication Sig Start Date End Date Taking? Authorizing Provider  ?ALPRAZolam (XANAX) 0.25 MG tablet Take 1 tablet (0.25 mg total) by mouth 2 (two) times daily as needed for anxiety (prior to a dental procedure). 10/03/21  Yes Dominic, Nunzio Cobbs, CNM  ?loratadine (CLARITIN) 10 MG tablet Take 10 mg by mouth daily.   Yes [provider]  ?omeprazole (PRILOSEC) 20 MG capsule Take 1 capsule (20 mg total) by mouth daily. 05/03/16  Yes Burnard Hawthorne, FNP  ? ? ?Allergies as of 10/04/2021 - Review Complete 10/04/2021  ?Allergen Reaction Noted  ? Morphine and related Itching 01/30/2017  ? ? ?Family History  ?Problem Relation Age of Onset  ? Cancer Mother 62  ?     ovarian sarcoma; patient tested and negative  ? Skin cancer Mother 76  ?     BASAL CELL  ? Hyperlipidemia Father   ? Heart disease Father   ? Hypertension Sister   ? Skin cancer Sister 67  ?     BASAL CELL  ? Hypertension Maternal Aunt   ? Skin cancer Maternal Aunt 35  ?     BASAL CELL  ? Cancer Cousin 40  ?     PANCREATIC  ? Colon cancer Neg Hx    ? ? ?Social History  ? ?Socioeconomic History  ? Marital status: Married  ?  Spouse name: Not on file  ? Number of children: 1  ? Years of education: 75  ? Highest education level: Not on file  ?Occupational History  ? Occupation: PRODUCT ANALYST  ?  Employer: [employer]  ?Tobacco Use  ? Smoking status: Never  ? Smokeless tobacco: Never  ?Vaping Use  ? Vaping Use: Never used  ?Substance and Sexual Activity  ? Alcohol use: Yes  ?  Alcohol/week: 3.0 - 4.0 standard drinks  ?  Types: 3 - 4 Glasses of wine per week  ? Drug use: No  ? Sexual activity: Yes  ?  Birth control/protection: Surgical  ?Other Topics Concern  ? Not on file  ?Social History Narrative  ? Lives in [place]. Married. Son in college at [employer] and playing soccer.   ? Works at [employer] since 1999, Chiropractor.  ?   ? Enjoys going to [employer] , [employer], and learning how to [place].   ? .  ? ?Social Determinants of Health  ? ?Financial Resource Strain: Not on file  ?Food Insecurity: Not on  file  ?Transportation Needs: Not on file  ?Physical Activity: Not on file  ?Stress: Not on file  ?Social Connections: Not on file  ?Intimate Partner Violence: Not on file  ? ? ?Review of Systems: ?See HPI, otherwise negative ROS ? ?Physical Exam: ?BP (!) 147/84   Pulse 95   Temp 98.3 ?F (36.8 ?C) (Temporal)   Resp 17   Ht $R'5\' 1"'sZ$  (1.549 m)   Wt 53.5 kg   SpO2 98%   BMI 22.30 kg/m?  ?General:   Alert,  pleasant and cooperative in NAD ?Head:  Normocephalic and atraumatic. ?Neck:  Supple; no masses or thyromegaly. ?Lungs:  Clear throughout to auscultation.    ?Heart:  Regular rate and rhythm. ?Abdomen:  Soft, nontender and nondistended. Normal bowel sounds, without guarding, and without rebound.   ?Neurologic:  Alert and  oriented x4;  grossly normal neurologically. ? ?Impression/Plan: ?Lauren Leach is now here to undergo a screening colonoscopy. ? ?Risks, benefits, and alternatives regarding colonoscopy have been reviewed with the patient.  Questions have been  answered.  All parties agreeable. ?

## 2021-11-14 LAB — SURGICAL PATHOLOGY

## 2021-11-15 ENCOUNTER — Ambulatory Visit
Admission: RE | Admit: 2021-11-15 | Discharge: 2021-11-15 | Disposition: A | Discharge status: Home / Self Care | Payer: Managed Care, Other (non HMO) | Source: Ambulatory Visit | Attending: Licensed Practical Nurse | Admitting: Licensed Practical Nurse

## 2021-11-15 DIAGNOSIS — Z1231 Encounter for screening mammogram for malignant neoplasm of breast: Secondary | ICD-10-CM | POA: Insufficient documentation

## 2021-11-17 ENCOUNTER — Inpatient Hospital Stay
Admission: RE | Admit: 2021-11-17 | Discharge: 2021-11-17 | Disposition: A | Discharge status: Home / Self Care | Payer: Self-pay | Source: Ambulatory Visit | Attending: *Deleted | Admitting: *Deleted

## 2021-11-17 ENCOUNTER — Encounter: Payer: Self-pay | Admitting: Gastroenterology

## 2021-11-17 ENCOUNTER — Other Ambulatory Visit: Payer: Self-pay | Admitting: *Deleted

## 2021-11-17 DIAGNOSIS — Z1231 Encounter for screening mammogram for malignant neoplasm of breast: Secondary | ICD-10-CM

## 2022-09-04 ENCOUNTER — Encounter: Payer: Self-pay | Admitting: Family

## 2022-09-04 ENCOUNTER — Ambulatory Visit: Payer: Managed Care, Other (non HMO) | Admitting: Family

## 2022-09-04 VITALS — BP 126/82 | HR 90 | Temp 98.1°F | Ht 61.0 in | Wt 115.8 lb

## 2022-09-04 DIAGNOSIS — K219 Gastro-esophageal reflux disease without esophagitis: Secondary | ICD-10-CM | POA: Diagnosis not present

## 2022-09-04 DIAGNOSIS — T7840XS Allergy, unspecified, sequela: Secondary | ICD-10-CM | POA: Diagnosis not present

## 2022-09-04 DIAGNOSIS — Z23 Encounter for immunization: Secondary | ICD-10-CM | POA: Diagnosis not present

## 2022-09-04 DIAGNOSIS — N951 Menopausal and female climacteric states: Secondary | ICD-10-CM | POA: Insufficient documentation

## 2022-09-04 MED ORDER — PANTOPRAZOLE SODIUM 40 MG PO TBEC: 40.0000 mg | DELAYED_RELEASE_TABLET | ORAL | 0 refills | Status: DC

## 2022-09-04 MED ORDER — LORATADINE 10 MG PO TABS: 10.0000 mg | ORAL_TABLET | Freq: Every day | ORAL | 3 refills | Status: DC

## 2022-09-04 MED ORDER — FAMOTIDINE 20 MG PO TABS: 20.0000 mg | ORAL_TABLET | Freq: Every day | ORAL | 0 refills | Status: DC

## 2022-09-04 MED ORDER — PAROXETINE HCL 10 MG PO TABS: 10.0000 mg | ORAL_TABLET | Freq: Every day | ORAL | 3 refills | Status: AC

## 2022-09-04 NOTE — Assessment & Plan Note (Signed)
Presentation consistent with perimenopause.  Trial of Paxil 10 mg.  Pending labs for further investigation menopausal symptoms, fatigue . close follow-up.

## 2022-09-04 NOTE — Patient Instructions (Signed)
Trial of Paxil for menopausal symptoms. I have sent in Protonix for you to take 40 mg 30 minutes to 1 hour prior to consuming coffee with food in the morning.  You may use Pepcid AC at night. Please read the below.    Long term use beyond 3 months of proton pump inhibitors ( PPI's) include Nexium, Prilosec, Protonix, Dexilant, and Prevacid.  Some medical studies have identified an association between the long-term use of PPIs and the development of numerous adverse conditions including intestinal infections, pneumonia, stomach cancer, osteoporosis-related bone fractures, chronic kidney disease, deficiencies of certain vitamins and minerals, heart attacks, strokes, dementia, and early death. Those studies have flaws, are not considered definitive, and do not establish a cause-and-effect relationship between PPIs and the adverse conditions. High-quality studies have found that PPIs do not significantly increase the risk of any of these conditions except intestinal infections. Nevertheless, we cannot exclude the possibility that PPIs might confer a small increase in the risk of developing these adverse conditions. For the treatment of GERD, gastroenterologists generally agree that the well-established benefits of PPIs far outweigh their theoretical risks.  Nonetheless I recommend trial wean off of PPI's  if uncomplicated acid reflux and use of histamine 2 blocker ( Pepcid AC).   Patients with history of esophagitis  or Barrett's esophagus should remain on PPI.   I generally recommend trying to control acid reflux with lifestyle modifications including avoiding trigger foods, not eating 2 hours prior to bedtime. You may use histamine 2 blockers daily to twice daily ( this is Pepcid) and then when symptoms flare, start back on PPI for short course.   Of note, we will need to do an endoscopy ( upper GI) to evaluate your esophagus, stomach in the future if acid reflux persists are you develop red flag  symptoms: trouble swallowing, hoarseness, chronic cough, unexplained weight loss.

## 2022-09-04 NOTE — Progress Notes (Signed)
Assessment & Plan:  Gastroesophageal reflux disease, unspecified whether esophagitis present Assessment & Plan: Presentation consistent with acid reflux.  Stop omeprazole 20 mg BID.  Start Protonix 40 mg every morning, Pepcid AC 20 mg every afternoon.  Limit caffeine.  Counseled on long-term side effects of PPIs plan and follow-up to start weaning off of.  Orders: -     CBC with Differential/Platelet; Future -     Pantoprazole Sodium; Take 1 tablet (40 mg total) by mouth every morning.  Dispense: 90 tablet; Refill: 0 -     Famotidine; Take 1 tablet (20 mg total) by mouth at bedtime.  Dispense: 90 tablet; Refill: 0  Need for shingles vaccine -     Varicella-zoster vaccine IM  Menopause syndrome Assessment & Plan: Presentation consistent with perimenopause.  Trial of Paxil 10 mg.  Pending labs for further investigation menopausal symptoms, fatigue . close follow-up.  Orders: -     CBC with Differential/Platelet; Future -     Comprehensive metabolic panel; Future -     Hemoglobin A1c; Future -     Lipid panel; Future -     TSH; Future -     VITAMIN D 25 Hydroxy (Vit-D Deficiency, Fractures); Future -     Follicle stimulating hormone; Future -     Loratadine; Take 1 tablet (10 mg total) by mouth daily.  Dispense: 90 tablet; Refill: 3 -     Pantoprazole Sodium; Take 1 tablet (40 mg total) by mouth every morning.  Dispense: 90 tablet; Refill: 0 -     Famotidine; Take 1 tablet (20 mg total) by mouth at bedtime.  Dispense: 90 tablet; Refill: 0 -     PARoxetine HCl; Take 1 tablet (10 mg total) by mouth daily.  Dispense: 90 tablet; Refill: 3 -     B12 and Folate Panel; Future  Allergy, sequela -     Loratadine; Take 1 tablet (10 mg total) by mouth daily.  Dispense: 90 tablet; Refill: 3     Return precautions given.   Risks, benefits, and alternatives of the medications and treatment plan prescribed today were discussed, and patient expressed understanding.   Education regarding  symptom management and diagnosis given to patient on AVS either electronically or printed.  Return in about 6 weeks (around 10/16/2022) for Complete Physical Exam.  Lauren Paris, FNP  Subjective:    Patient ID: Lauren Leach, female    DOB: 02-06-1971, 52 y.o.   MRN: [id number]  CC: Lauren Leach is a 52 y.o. female who presents today for an acute visit.    HPI: Complains of epigastric pain. She had an episode 2 weeks ago in which she felt like throat 'was shutting down'. She drank water and took omeprazole with resolution of symptoms. She had had fried onion rings, burger that night.  She noticed after coffee this morning, epigastric burning started.  She has noted spicy, marinara type foods will aggravate symptom.  She has lost 7 lbs.  Endorses dry cough, hoarseness, 'terrible taste in mouth' in the morning. There is no pain is swallowing.   No associated cp, sob, left arm numbness, vomiting.   Compliant with omeprazole '20mg'$  once to BID.   She has started to have hot flashes. No fever.  Hot flashes more bothersome at night.  Endorses irritability, increased fatigue.  She has been more tearful lately.   No si/ hi.    Colonoscopy 10/2021   Allergies: Morphine and related Current Outpatient Medications on File Prior to  Visit  Medication Sig Dispense Refill   ALPRAZolam (XANAX) 0.25 MG tablet Take 1 tablet (0.25 mg total) by mouth 2 (two) times daily as needed for anxiety (prior to a dental procedure). 10 tablet 0   No current facility-administered medications on file prior to visit.    Review of Systems  Constitutional:  Positive for appetite change. Negative for chills and fever.  HENT:  Positive for voice change (hoarseness). Negative for trouble swallowing.   Respiratory:  Negative for cough.   Cardiovascular:  Negative for chest pain and palpitations.  Gastrointestinal:  Positive for abdominal pain. Negative for nausea and vomiting.  Psychiatric/Behavioral:  Positive for sleep  disturbance. Negative for suicidal ideas.       Objective:    BP 126/82   Pulse 90   Temp 98.1 F (36.7 C) (Oral)   Ht '5\' 1"'$  (1.549 m)   Wt 115 lb 12.8 oz (52.5 kg)   LMP  (LMP Unknown)   SpO2 99%   BMI 21.88 kg/m   BP Readings from Last 3 Encounters:  09/04/22 126/82  11/11/21 130/81  10/03/21 122/60   Wt Readings from Last 3 Encounters:  09/04/22 115 lb 12.8 oz (52.5 kg)  11/11/21 118 lb (53.5 kg)  10/03/21 122 lb (55.3 kg)    Physical Exam Vitals reviewed.  Constitutional:      Appearance: Normal appearance. She is well-developed.  Eyes:     Conjunctiva/sclera: Conjunctivae normal.  Neck:     Thyroid: No thyroid mass or thyroid tenderness.  Cardiovascular:     Rate and Rhythm: Normal rate and regular rhythm.     Pulses: Normal pulses.     Heart sounds: Normal heart sounds.  Pulmonary:     Effort: Pulmonary effort is normal.     Breath sounds: Normal breath sounds. No wheezing, rhonchi or rales.  Abdominal:     General: Bowel sounds are normal. There is no distension.     Palpations: Abdomen is soft. Abdomen is not rigid. There is no fluid wave or mass.     Tenderness: There is no abdominal tenderness. There is no guarding or rebound.  Skin:    General: Skin is warm and dry.  Neurological:     Mental Status: She is alert.  Psychiatric:        Speech: Speech normal.        Behavior: Behavior normal.        Thought Content: Thought content normal.

## 2022-09-04 NOTE — Assessment & Plan Note (Signed)
Presentation consistent with acid reflux.  Stop omeprazole 20 mg BID.  Start Protonix 40 mg every morning, Pepcid AC 20 mg every afternoon.  Limit caffeine.  Counseled on long-term side effects of PPIs plan and follow-up to start weaning off of.

## 2022-09-05 ENCOUNTER — Other Ambulatory Visit (INDEPENDENT_AMBULATORY_CARE_PROVIDER_SITE_OTHER): Payer: Managed Care, Other (non HMO)

## 2022-09-05 DIAGNOSIS — K219 Gastro-esophageal reflux disease without esophagitis: Secondary | ICD-10-CM

## 2022-09-05 DIAGNOSIS — N951 Menopausal and female climacteric states: Secondary | ICD-10-CM

## 2022-09-06 LAB — LIPID PANEL
Chol/HDL Ratio: 3.8 ratio (ref 0.0–4.4)
Cholesterol, Total: 249 mg/dL — ABNORMAL HIGH (ref 100–199)
HDL: 66 mg/dL (ref >39)
LDL Chol Calc (NIH): 165 mg/dL — ABNORMAL HIGH (ref 0–99)
Triglycerides: 103 mg/dL (ref 0–149)
VLDL Cholesterol Cal: 18 mg/dL (ref 5–40)

## 2022-09-06 LAB — CBC WITH DIFFERENTIAL/PLATELET
Basophils Absolute: 0 10*3/uL (ref 0.0–0.2)
Basos: 0 %
EOS (ABSOLUTE): 0.2 10*3/uL (ref 0.0–0.4)
Eos: 3 %
Hematocrit: 37.8 % (ref 34.0–46.6)
Hemoglobin: 13 g/dL (ref 11.1–15.9)
Immature Grans (Abs): 0 10*3/uL (ref 0.0–0.1)
Immature Granulocytes: 0 %
Lymphocytes Absolute: 1.5 10*3/uL (ref 0.7–3.1)
Lymphs: 20 %
MCH: 32.4 pg (ref 26.6–33.0)
MCHC: 34.4 g/dL (ref 31.5–35.7)
MCV: 94 fL (ref 79–97)
Monocytes Absolute: 0.5 10*3/uL (ref 0.1–0.9)
Monocytes: 7 %
Neutrophils Absolute: 5.3 10*3/uL (ref 1.4–7.0)
Neutrophils: 70 %
Platelets: 247 10*3/uL (ref 150–450)
RBC: 4.01 x10E6/uL (ref 3.77–5.28)
RDW: 11.3 % — ABNORMAL LOW (ref 11.7–15.4)
WBC: 7.5 10*3/uL (ref 3.4–10.8)

## 2022-09-06 LAB — FOLLICLE STIMULATING HORMONE: FSH: 109 m[IU]/mL

## 2022-09-06 LAB — COMPREHENSIVE METABOLIC PANEL
ALT: 30 IU/L (ref 0–32)
AST: 22 IU/L (ref 0–40)
Albumin/Globulin Ratio: 1.9 (ref 1.2–2.2)
Albumin: 4.6 g/dL (ref 3.9–4.9)
Alkaline Phosphatase: 84 IU/L (ref 44–121)
BUN/Creatinine Ratio: 15 (ref 9–23)
BUN: 11 mg/dL (ref 6–24)
Bilirubin Total: 0.6 mg/dL (ref 0.0–1.2)
CO2: 25 mmol/L (ref 20–29)
Calcium: 9.9 mg/dL (ref 8.7–10.2)
Chloride: 102 mmol/L (ref 96–106)
Creatinine, Ser: 0.75 mg/dL (ref 0.57–1.00)
Globulin, Total: 2.4 g/dL (ref 1.5–4.5)
Glucose: 107 mg/dL — ABNORMAL HIGH (ref 70–99)
Potassium: 4.3 mmol/L (ref 3.5–5.2)
Sodium: 141 mmol/L (ref 134–144)
Total Protein: 7 g/dL (ref 6.0–8.5)
eGFR: 97 mL/min/{1.73_m2} (ref >59)

## 2022-09-06 LAB — HEMOGLOBIN A1C
Est. average glucose Bld gHb Est-mCnc: 126 mg/dL
Hgb A1c MFr Bld: 6 % — ABNORMAL HIGH (ref 4.8–5.6)

## 2022-09-06 LAB — TSH: TSH: 1.44 u[IU]/mL (ref 0.450–4.500)

## 2022-09-06 LAB — B12 AND FOLATE PANEL
Folate: 16.8 ng/mL (ref >3.0)
Vitamin B-12: 649 pg/mL (ref 232–1245)

## 2022-09-06 LAB — VITAMIN D 25 HYDROXY (VIT D DEFICIENCY, FRACTURES): Vit D, 25-Hydroxy: 54.8 ng/mL (ref 30.0–100.0)

## 2022-10-18 ENCOUNTER — Encounter: Payer: Managed Care, Other (non HMO) | Admitting: Family

## 2022-10-23 ENCOUNTER — Ambulatory Visit: Payer: Managed Care, Other (non HMO) | Admitting: Family

## 2022-10-23 ENCOUNTER — Encounter: Payer: Self-pay | Admitting: Family

## 2022-10-23 VITALS — BP 132/80 | HR 76 | Temp 98.2°F | Ht 61.0 in | Wt 119.4 lb

## 2022-10-23 DIAGNOSIS — Z0000 Encounter for general adult medical examination without abnormal findings: Secondary | ICD-10-CM

## 2022-10-23 DIAGNOSIS — Z0001 Encounter for general adult medical examination with abnormal findings: Secondary | ICD-10-CM

## 2022-10-23 DIAGNOSIS — Z23 Encounter for immunization: Secondary | ICD-10-CM | POA: Diagnosis not present

## 2022-10-23 DIAGNOSIS — R109 Unspecified abdominal pain: Secondary | ICD-10-CM

## 2022-10-23 DIAGNOSIS — Z8249 Family history of ischemic heart disease and other diseases of the circulatory system: Secondary | ICD-10-CM

## 2022-10-23 DIAGNOSIS — Z1231 Encounter for screening mammogram for malignant neoplasm of breast: Secondary | ICD-10-CM

## 2022-10-23 DIAGNOSIS — K219 Gastro-esophageal reflux disease without esophagitis: Secondary | ICD-10-CM

## 2022-10-23 NOTE — Assessment & Plan Note (Signed)
The 10-year ASCVD risk score (Lauren Leach DK, et al., 2019) is: 1.4% Discussed LDL 164, father's history CAD.  We have opted to pursue CT calcium score to establish a baseline and discuss if more aggressive therapy with statin, ASA therapy would be warranted.

## 2022-10-23 NOTE — Patient Instructions (Addendum)
Ordered abdominal ultrasound to further evaluate left left upper abdominal pain. Let us know if you dont hear back within a week in regards to an appointment being scheduled.   So that you are aware, if you are [facility] MyChart user , please pay attention to your MyChart messages as you may receive a MyChart message with a phone number to call and schedule this test/appointment own your own from our referral coordinator. This is a new process so I do not want you to miss this message.  If you are not a MyChart user, you will receive a phone call.    It is reasonable to try over-the-counter Voltaren gel with heating pad to see if this feels better.  Please let me know if pain does not resolve.   I have ordered CT calcium score ( SELF PAY option)  to further stratify your overall cardiovascular risk .   An estimate of cost is $150-200 out-of-pocket as not covered by insurance. However please call your insurance company in advance to ensure no other costs so that you do not have any unexpected bills.     In [facility], It can be performed at any of these 3 locations (outpatient imaging center [facility]) at [address]/ [facility], [facility] or [facility] outpatient center)   I have placed your order to [facility] in [place] as  generally most convenient.   Phone Number to [facility] on [address] is is [phone] to get scheduled. You may ask for tech there, Colletta Maryland, she is great as well.   Please call to get scheduled and if any issues at all in doing so, please let me know.   Below an article from [facility] regarding the test.   [email or web address].   Exams We Offer: Cardiac CT Calcium Score  Knowing your score could save your life. A cardiac CT calcium score, also known as a coronary calcium scan, is a quick,  convenient and noninvasive way of evaluating the amount of calcified (hard) plaque in your heart vessels. The level of calcium equates to the extent of plaque build-up in your arteries. Plaque in the arteries can cause heart attacks.  The radiologist reads the images and sends your doctor a report with a calcium score. Patients with higher scores have a greater risk for a heart attack, heart disease or stroke. Knowing your score can help your doctor decide on blood pressure and cholesterol goals that will minimize your risk as much as possible.  The [employer] of Cardiology found that Coronary artery calcification (CAC) is an excellent cardiovascular disease risk marker and can help guide the decision to use cholesterol reducing medications such as statins. A negative calcium score may reduce the need for statins in otherwise eligible patients.  The exam takes less than 10 minutes, is painless and does not require any IV or oral contrast. At [facility] locations, the out-of-pocket fee without insurance is $75. At the time of scheduling, please let us know if you want to process the exam through your insurance or self-pay at the rate of $75. Patients who want to self-pay should not submit their insurance card when checking in for the appointment.   Who should get a Cardiac CT Calcium Score: Middle age adults at intermediate risk of heart disease Family history of heart disease Borderline high cholesterol, high blood pressure or diabetes Overweight or physical inactivity Uncertain about taking daily preventive medical therapy   Health Maintenance for Postmenopausal  Women Menopause is a normal process in which your ability to get pregnant comes to an end. This process happens slowly over many months or years, usually between the ages of 48 and 75. Menopause is complete when you have missed your menstrual period for 12 months. It is important to talk with your health care  provider about some of the most common conditions that affect women after menopause (postmenopausal women). These include heart disease, cancer, and bone loss (osteoporosis). Adopting a healthy lifestyle and getting preventive care can help to promote your health and wellness. The actions you take can also lower your chances of developing some of these common conditions. What are the signs and symptoms of menopause? During menopause, you may have the following symptoms: Hot flashes. These can be moderate or severe. Night sweats. Decrease in sex drive. Mood swings. Headaches. Tiredness (fatigue). Irritability. Memory problems. Problems falling asleep or staying asleep. Talk with your health care provider about treatment options for your symptoms. Do I need hormone replacement therapy? Hormone replacement therapy is effective in treating symptoms that are caused by menopause, such as hot flashes and night sweats. Hormone replacement carries certain risks, especially as you become older. If you are thinking about using estrogen or estrogen with progestin, discuss the benefits and risks with your health care provider. How can I reduce my risk for heart disease and stroke? The risk of heart disease, heart attack, and stroke increases as you age. One of the causes may be a change in the body's hormones during menopause. This can affect how your body uses dietary fats, triglycerides, and cholesterol. Heart attack and stroke are medical emergencies. There are many things that you can do to help prevent heart disease and stroke. Watch your blood pressure High blood pressure causes heart disease and increases the risk of stroke. This is more likely to develop in people who have high blood pressure readings or are overweight. Have your blood pressure checked: Every 3-5 years if you are 27-32 years of age. Every year if you are 67 years old or older. Eat a healthy diet  Eat a diet that includes plenty  of vegetables, fruits, low-fat dairy products, and lean protein. Do not eat a lot of foods that are high in solid fats, added sugars, or sodium. Get regular exercise Get regular exercise. This is one of the most important things you can do for your health. Most adults should: Try to exercise for at least 150 minutes each week. The exercise should increase your heart rate and make you sweat (moderate-intensity exercise). Try to do strengthening exercises at least twice each week. Do these in addition to the moderate-intensity exercise. Spend less time sitting. Even light physical activity can be beneficial. Other tips Work with your health care provider to achieve or maintain a healthy weight. Do not use any products that contain nicotine or tobacco. These products include cigarettes, chewing tobacco, and vaping devices, such as e-cigarettes. If you need help quitting, ask your health care provider. Know your numbers. Ask your health care provider to check your cholesterol and your blood sugar (glucose). Continue to have your blood tested as directed by your health care provider. Do I need screening for cancer? Depending on your health history and family history, you may need to have cancer screenings at different stages of your life. This may include screening for: Breast cancer. Cervical cancer. Lung cancer. Colorectal cancer. What is my risk for osteoporosis? After menopause, you may be at increased risk  for osteoporosis. Osteoporosis is a condition in which bone destruction happens more quickly than new bone creation. To help prevent osteoporosis or the bone fractures that can happen because of osteoporosis, you may take the following actions: If you are 38-92 years old, get at least 1,000 mg of calcium and at least 600 international units (IU) of vitamin D per day. If you are older than age 13 but younger than age 38, get at least 1,200 mg of calcium and at least 600 international units (IU)  of vitamin D per day. If you are older than age 10, get at least 1,200 mg of calcium and at least 800 international units (IU) of vitamin D per day. Smoking and drinking excessive alcohol increase the risk of osteoporosis. Eat foods that are rich in calcium and vitamin D, and do weight-bearing exercises several times each week as directed by your health care provider. How does menopause affect my mental health? Depression may occur at any age, but it is more common as you become older. Common symptoms of depression include: Feeling depressed. Changes in sleep patterns. Changes in appetite or eating patterns. Feeling an overall lack of motivation or enjoyment of activities that you previously enjoyed. Frequent crying spells. Talk with your health care provider if you think that you are experiencing any of these symptoms. General instructions See your health care provider for regular wellness exams and vaccines. This may include: Scheduling regular health, dental, and eye exams. Getting and maintaining your vaccines. These include: Influenza vaccine. Get this vaccine each year before the flu season begins. Pneumonia vaccine. Shingles vaccine. Tetanus, diphtheria, and pertussis (Tdap) booster vaccine. Your health care provider may also recommend other immunizations. Tell your health care provider if you have ever been abused or do not feel safe at home. Summary Menopause is a normal process in which your ability to get pregnant comes to an end. This condition causes hot flashes, night sweats, decreased interest in sex, mood swings, headaches, or lack of sleep. Treatment for this condition may include hormone replacement therapy. Take actions to keep yourself healthy, including exercising regularly, eating a healthy diet, watching your weight, and checking your blood pressure and blood sugar levels. Get screened for cancer and depression. Make sure that you are up to date with all your  vaccines. This information is not intended to replace advice given to you by your health care provider. Make sure you discuss any questions you have with your health care provider. Document Revised: 12/20/2020 Document Reviewed: 12/20/2020 Elsevier Patient Education  [employer].

## 2022-10-23 NOTE — Assessment & Plan Note (Signed)
Clinical breast exam performed today.  Pap smear deferred in the absence of complaints and Pap smear is up-to-date.  Patient will schedule mammogram next month.

## 2022-10-23 NOTE — Assessment & Plan Note (Addendum)
Etiology nonspecific at this time.  I do question if with her rib cage being quite prominent if she is injured somehow, suffered a minor trauma causing pain.  Pending chest and rib series, abdominal ultrasound.  We did discuss consideration for thoracic, lumbar spine films.  She declines investigating for back etiology at this time.  Advised heat, Voltaren gel.  She will let me how she is doing.

## 2022-10-23 NOTE — Progress Notes (Signed)
Assessment & Plan:  Encounter for screening mammogram for malignant neoplasm of breast -     3D Screening Mammogram, Left and Right; Future  Family history of early CAD Assessment & Plan: The 10-year ASCVD risk score (Avram Danielson DK, et al., 2019) is: 1.4% Discussed LDL 164, father's history CAD.  We have opted to pursue CT calcium score to establish a baseline and discuss if more aggressive therapy with statin, ASA therapy would be warranted.  Orders: -     CT CARDIAC SCORING (SELF PAY ONLY); Future  Left sided abdominal pain Assessment & Plan: Etiology nonspecific at this time.  I do question if with her rib cage being quite prominent if she is injured somehow, suffered a minor trauma causing pain.  Pending chest and rib series, abdominal ultrasound.  We did discuss consideration for thoracic, lumbar spine films.  She declines investigating for back etiology at this time.  Advised heat, Voltaren gel.  She will let me how she is doing.  Orders: -     DG Ribs Bilateral W/Chest; Future -     US Abdomen Complete; Future  Need for diphtheria-tetanus-pertussis (Tdap) vaccine -     Tdap vaccine greater than or equal to 7yo IM  Gastroesophageal reflux disease, unspecified whether esophagitis present Assessment & Plan: Symptoms resolved at this time on Protonix 40 mg daily.  She takes Pepcid 20 mg qhs prn.    Annual physical exam Assessment & Plan: Clinical breast exam performed today.  Pap smear deferred in the absence of complaints and Pap smear is up-to-date.  Patient will schedule mammogram next month.      Return precautions given.   Risks, benefits, and alternatives of the medications and treatment plan prescribed today were discussed, and patient expressed understanding.   Education regarding symptom management and diagnosis given to patient on AVS either electronically or printed.  Return in about 2 months (around 12/23/2022).  Mable Paris, FNP  Subjective:     Patient ID: Lauren Leach, female    DOB: Dec 30, 1970, 52 y.o.   MRN: [id number]  CC: Lauren Leach is a 52 y.o. female who presents today for physical exam.    HPI: Complains of left sided rib pain x 2 weeks, comes and goes.  She can press on the area and feel pain  She has recently resumed pepcid qhs without relief.   No new exercise , injury.   No constipation, dysuria, diarrhea, nausea, cp, sob, left arm numbness.   No history of cancer  She feels epigastric pain has significantly improved.  She is compliant with Protonix 40 mg daily.  She has been taking Pepcid 20 mg nightly regularly however recently had stopped medication, then resumed.     Colorectal Cancer Screening: UTD , Dr Allen Norris, repeat in 0 years Breast Cancer Screening: Mammogram UTD Cervical Cancer Screening: UTD, negative HPV, NIL 10/03/21 Bone Health screening/DEXA for 65+: No increased fracture risk. Defer screening at this time.        Tetanus - due       Exercise: Gets regular exercise.   Alcohol use:  occcasional Smoking/tobacco use: Nonsmoker.    Health Maintenance  Topic Date Due   COVID-19 Vaccine (1) Never done   Hepatitis C Screening  Never done   DTaP/Tdap/Td (1 - Tdap) Never done   INFLUENZA VACCINE  11/12/2022 (Originally 03/14/2022)   Zoster Vaccines- Shingrix (2 of 2) 10/30/2022   MAMMOGRAM  11/16/2023   PAP SMEAR-Modifier  10/03/2024   COLONOSCOPY (Pts 45-69yr  Insurance coverage will need to be confirmed)  11/12/2031   HIV Screening  Completed   HPV VACCINES  Aged Out    ALLERGIES: Morphine and related  Current Outpatient Medications on File Prior to Visit  Medication Sig Dispense Refill   ALPRAZolam (XANAX) 0.25 MG tablet Take 1 tablet (0.25 mg total) by mouth 2 (two) times daily as needed for anxiety (prior to a dental procedure). 10 tablet 0   famotidine (PEPCID) 20 MG tablet Take 1 tablet (20 mg total) by mouth at bedtime. 90 tablet 0   loratadine (CLARITIN) 10 MG tablet Take 1 tablet (10 mg  total) by mouth daily. 90 tablet 3   pantoprazole (PROTONIX) 40 MG tablet Take 1 tablet (40 mg total) by mouth every morning. 90 tablet 0   PARoxetine (PAXIL) 10 MG tablet Take 1 tablet (10 mg total) by mouth daily. 90 tablet 3   No current facility-administered medications on file prior to visit.    Review of Systems  Constitutional:  Negative for chills, fever and unexpected weight change.  HENT:  Negative for congestion.   Respiratory:  Negative for cough.   Cardiovascular:  Negative for chest pain, palpitations and leg swelling.  Gastrointestinal:  Positive for abdominal pain. Negative for constipation, nausea and vomiting.  Genitourinary:  Negative for dysuria.  Musculoskeletal:  Negative for arthralgias and myalgias.  Skin:  Negative for rash.  Neurological:  Negative for headaches.  Hematological:  Negative for adenopathy.  Psychiatric/Behavioral:  Negative for confusion.       Objective:    BP 132/80   Pulse 76   Temp 98.2 F (36.8 C) (Oral)   Ht '5\' 1"'$  (1.549 m)   Wt 119 lb 6.4 oz (54.2 kg)   LMP  (LMP Unknown)   SpO2 98%   BMI 22.56 kg/m   BP Readings from Last 3 Encounters:  10/23/22 132/80  09/04/22 126/82  11/11/21 130/81   Wt Readings from Last 3 Encounters:  10/23/22 119 lb 6.4 oz (54.2 kg)  09/04/22 115 lb 12.8 oz (52.5 kg)  11/11/21 118 lb (53.5 kg)    Physical Exam Vitals reviewed.  Constitutional:      Appearance: Normal appearance. She is well-developed.  Eyes:     Conjunctiva/sclera: Conjunctivae normal.  Neck:     Thyroid: No thyroid mass or thyromegaly.  Cardiovascular:     Rate and Rhythm: Normal rate and regular rhythm.     Pulses: Normal pulses.     Heart sounds: Normal heart sounds.  Pulmonary:     Effort: Pulmonary effort is normal.     Breath sounds: Normal breath sounds. No wheezing, rhonchi or rales.  Chest:  Breasts:    Breasts are symmetrical.     Right: No inverted nipple, mass, nipple discharge, skin change or  tenderness.     Left: No inverted nipple, mass, nipple discharge, skin change or tenderness.       Comments: Focal tenderness over left last rib. Bilateral ribs are prominent, symmetric. No bony step off.  Abdominal:     General: Bowel sounds are normal. There is no distension.     Palpations: Abdomen is soft. Abdomen is not rigid. There is no fluid wave or mass.     Tenderness: There is no abdominal tenderness. There is no guarding or rebound.  Lymphadenopathy:     Head:     Right side of head: No submental, submandibular, tonsillar, preauricular, posterior auricular or occipital adenopathy.     Left side of  head: No submental, submandibular, tonsillar, preauricular, posterior auricular or occipital adenopathy.     Cervical: No cervical adenopathy.     Right cervical: No superficial, deep or posterior cervical adenopathy.    Left cervical: No superficial, deep or posterior cervical adenopathy.  Skin:    General: Skin is warm and dry.  Neurological:     Mental Status: She is alert.  Psychiatric:        Speech: Speech normal.        Behavior: Behavior normal.        Thought Content: Thought content normal.

## 2022-10-23 NOTE — Assessment & Plan Note (Signed)
Symptoms resolved at this time on Protonix 40 mg daily.  She takes Pepcid 20 mg qhs prn.

## 2022-11-02 ENCOUNTER — Ambulatory Visit: Payer: Managed Care, Other (non HMO)

## 2022-11-06 ENCOUNTER — Ambulatory Visit (INDEPENDENT_AMBULATORY_CARE_PROVIDER_SITE_OTHER): Payer: Managed Care, Other (non HMO)

## 2022-11-06 DIAGNOSIS — Z23 Encounter for immunization: Secondary | ICD-10-CM

## 2022-11-06 NOTE — Progress Notes (Signed)
presents today for injection per MD orders. Shingles injection administered IM in right Upper Arm. Administration without incident. Patient tolerated well.  Aadarsh Cozort,cma

## 2022-11-14 ENCOUNTER — Ambulatory Visit
Admission: RE | Admit: 2022-11-14 | Discharge: 2022-11-14 | Disposition: A | Discharge status: Home / Self Care | Payer: Managed Care, Other (non HMO) | Source: Ambulatory Visit | Attending: Family | Admitting: Family

## 2022-11-14 DIAGNOSIS — Z8249 Family history of ischemic heart disease and other diseases of the circulatory system: Secondary | ICD-10-CM | POA: Insufficient documentation

## 2022-11-20 ENCOUNTER — Ambulatory Visit
Admission: RE | Admit: 2022-11-20 | Discharge: 2022-11-20 | Disposition: A | Discharge status: Home / Self Care | Payer: Managed Care, Other (non HMO) | Source: Ambulatory Visit | Attending: Family | Admitting: Family

## 2022-11-20 DIAGNOSIS — Z1231 Encounter for screening mammogram for malignant neoplasm of breast: Secondary | ICD-10-CM | POA: Insufficient documentation

## 2022-12-01 ENCOUNTER — Other Ambulatory Visit: Payer: Self-pay | Admitting: Family

## 2022-12-01 DIAGNOSIS — K219 Gastro-esophageal reflux disease without esophagitis: Secondary | ICD-10-CM

## 2022-12-01 DIAGNOSIS — N951 Menopausal and female climacteric states: Secondary | ICD-10-CM

## 2022-12-25 ENCOUNTER — Ambulatory Visit: Payer: Managed Care, Other (non HMO) | Admitting: Family

## 2022-12-25 ENCOUNTER — Encounter: Payer: Self-pay | Admitting: Family

## 2022-12-25 VITALS — BP 128/78 | HR 79 | Temp 97.8°F | Ht 61.0 in | Wt 118.2 lb

## 2022-12-25 DIAGNOSIS — J3081 Allergic rhinitis due to animal (cat) (dog) hair and dander: Secondary | ICD-10-CM | POA: Diagnosis not present

## 2022-12-25 DIAGNOSIS — J309 Allergic rhinitis, unspecified: Secondary | ICD-10-CM | POA: Insufficient documentation

## 2022-12-25 DIAGNOSIS — K219 Gastro-esophageal reflux disease without esophagitis: Secondary | ICD-10-CM

## 2022-12-25 MED ORDER — MONTELUKAST SODIUM 10 MG PO TABS
10.0000 mg | ORAL_TABLET | Freq: Every day | ORAL | 3 refills | Status: DC
Start: 2022-12-25 — End: 2023-10-03

## 2022-12-25 MED ORDER — LEVOCETIRIZINE DIHYDROCHLORIDE 5 MG PO TABS
5.0000 mg | ORAL_TABLET | Freq: Every evening | ORAL | 3 refills | Status: DC
Start: 2022-12-25 — End: 2023-10-03

## 2022-12-25 NOTE — Patient Instructions (Signed)
Take protonix 40mg  and alterate every other with protonix 20mg .   Then take protonix 20mg  every other day for week, then stop.     Long term use beyond 3 months of proton pump inhibitors ( PPI's) include Nexium, Prilosec, Protonix, Dexilant, and Prevacid.  Some medical studies have identified an association between the long-term use of PPIs and the development of numerous adverse conditions including intestinal infections, pneumonia, stomach cancer, osteoporosis-related bone fractures, chronic kidney disease, deficiencies of certain vitamins and minerals, heart attacks, strokes, dementia, and early death. Those studies have flaws, are not considered definitive, and do not establish a cause-and-effect relationship between PPIs and the adverse conditions. High-quality studies have found that PPIs do not significantly increase the risk of any of these conditions except intestinal infections. Nevertheless, we cannot exclude the possibility that PPIs might confer a small increase in the risk of developing these adverse conditions. For the treatment of GERD, gastroenterologists generally agree that the well-established benefits of PPIs far outweigh their theoretical risks.  Nonetheless I recommend trial wean off of PPI's  if uncomplicated acid reflux and use of histamine 2 blocker ( Pepcid AC).   Patients with history of esophagitis  or Barrett's esophagus should remain on PPI.   I generally recommend trying to control acid reflux with lifestyle modifications including avoiding trigger foods, not eating 2 hours prior to bedtime. You may use histamine 2 blockers daily to twice daily ( this is Pepcid) and then when symptoms flare, start back on PPI for short course.   Of note, we will need to do an endoscopy ( upper GI) to evaluate your esophagus, stomach in the future if acid reflux persists are you develop red flag symptoms: trouble swallowing, hoarseness, chronic cough, unexplained weight loss.

## 2022-12-25 NOTE — Progress Notes (Unsigned)
   Assessment & Plan:  There are no diagnoses linked to this encounter.   Return precautions given.   Risks, benefits, and alternatives of the medications and treatment plan prescribed today were discussed, and patient expressed understanding.   Education regarding symptom management and diagnosis given to patient on AVS either electronically or printed.  No follow-ups on file.  Rennie Plowman, FNP  Subjective:    Patient ID: Lauren Leach, female    DOB: 10-04-1970, 52 y.o.   MRN: [id number]  CC: Lauren Leach is a 52 y.o. female who presents today for follow up.   HPI: Complains of hoarseness, sneezing 'none stop'  She feels frurstared.   She is allergic to her dog  She has tried flonase howeever too drying.   Discuss calcium score 11/13/2021; there are no results in Epic     Follow-up left-sided abdominal pain has completely resolved   She remains compliant with Protonix 40 mg daily, Pepcid 20 mg nightly as needed    Allergies: Morphine and related Current Outpatient Medications on File Prior to Visit  Medication Sig Dispense Refill   ALPRAZolam (XANAX) 0.25 MG tablet Take 1 tablet (0.25 mg total) by mouth 2 (two) times daily as needed for anxiety (prior to a dental procedure). 10 tablet 0   famotidine (PEPCID) 20 MG tablet Take 1 tablet (20 mg total) by mouth at bedtime. 90 tablet 0   loratadine (CLARITIN) 10 MG tablet Take 1 tablet (10 mg total) by mouth daily. 90 tablet 3   pantoprazole (PROTONIX) 40 MG tablet TAKE 1 TABLET(40 MG) BY MOUTH EVERY MORNING 90 tablet 0   PARoxetine (PAXIL) 10 MG tablet Take 1 tablet (10 mg total) by mouth daily. 90 tablet 3   No current facility-administered medications on file prior to visit.    Review of Systems    Objective:    There were no vitals taken for this visit. BP Readings from Last 3 Encounters:  10/23/22 132/80  09/04/22 126/82  11/11/21 130/81   Wt Readings from Last 3 Encounters:  10/23/22 119 lb 6.4 oz (54.2 kg)   09/04/22 115 lb 12.8 oz (52.5 kg)  11/11/21 118 lb (53.5 kg)    Physical Exam

## 2022-12-26 ENCOUNTER — Telehealth: Payer: Self-pay | Admitting: Family

## 2022-12-26 NOTE — Assessment & Plan Note (Signed)
Suboptimal control.Advised CT sinus to evaluate for sinus pathology. She declines. She would like to trial Xyzal with Singulair.  Counseled on blackbox warning as relates suicide ideation on Singulair.  Discussed consideration for ENT and referral for allergy testing and immunotherapy. Strict return precautions given to patient if nasal bleeding were to recur or symptoms do not improve altogether, I would recommend a prompt ENT consult for further evaluation

## 2022-12-26 NOTE — Telephone Encounter (Signed)
Call [facility] radiology   Pt had CT calcium score 11/13/2021; there are no results in Epic .   Let me know

## 2022-12-26 NOTE — Assessment & Plan Note (Signed)
Symptoms improved.  Discussed long-term risk of PPI.  Advised weaning from Protonix and using for flares.  Information further provided on after visit summary.

## 2022-12-26 NOTE — Telephone Encounter (Signed)
Spoke to [place] at [facility] radiology and she stated that she was going to find out why it has not been read yet and give me a call back. Phone number was given

## 2022-12-28 NOTE — Telephone Encounter (Signed)
Results are released in chart

## 2023-01-17 ENCOUNTER — Telehealth: Payer: Managed Care, Other (non HMO) | Admitting: Nurse Practitioner

## 2023-01-17 DIAGNOSIS — R300 Dysuria: Secondary | ICD-10-CM | POA: Diagnosis not present

## 2023-01-17 MED ORDER — CEPHALEXIN 500 MG PO CAPS
500.0000 mg | ORAL_CAPSULE | Freq: Two times a day (BID) | ORAL | 0 refills | Status: AC
Start: 2023-01-17 — End: 2023-01-24

## 2023-01-17 NOTE — Progress Notes (Signed)

## 2023-01-21 IMAGING — MG MM DIGITAL SCREENING BILAT W/ TOMO AND CAD
8 series · 9 of 24 positions shown · non-contrast
Comparison: Previous exam(s).

CLINICAL DATA: Screening.

EXAM:
DIGITAL SCREENING BILATERAL MAMMOGRAM WITH TOMOSYNTHESIS AND CAD
TECHNIQUE: Bilateral screening digital craniocaudal and mediolateral oblique
mammograms were obtained. Bilateral screening digital breast
tomosynthesis was performed. The images were evaluated with
computer-aided detection.

[R MLO synth-2D]
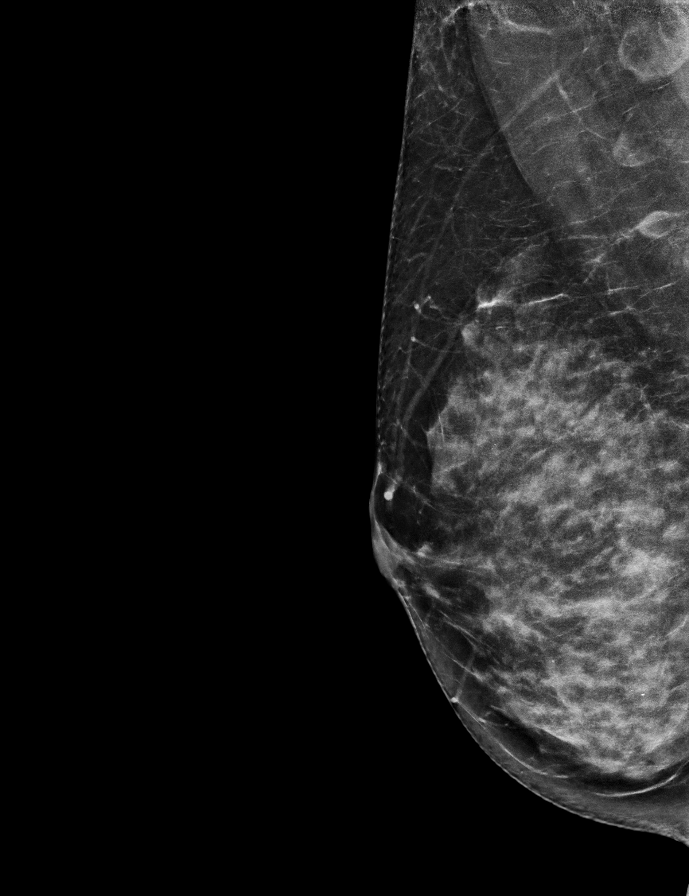

[L MLO synth-2D]
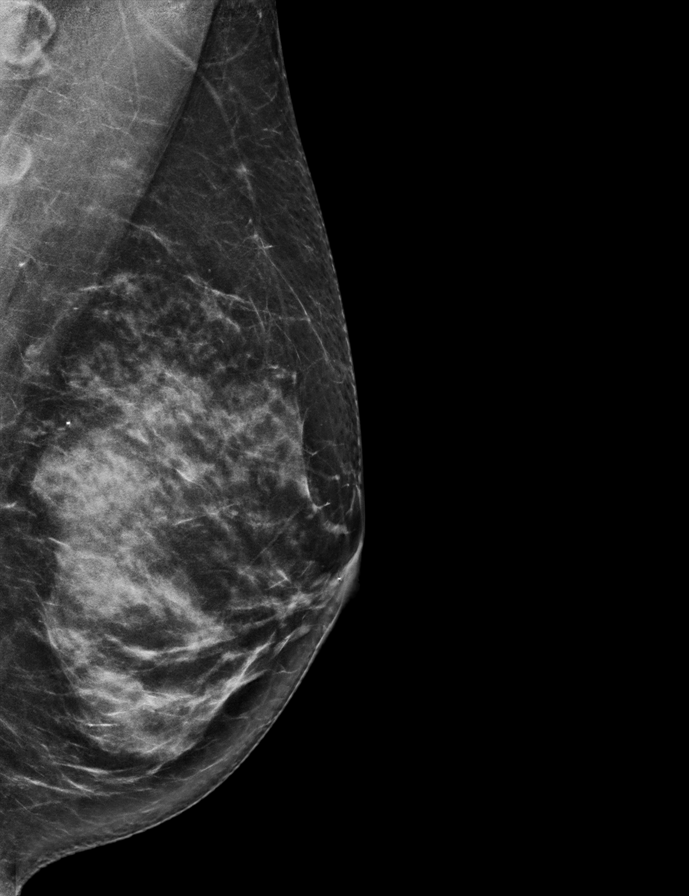

[L CC synth-2D]
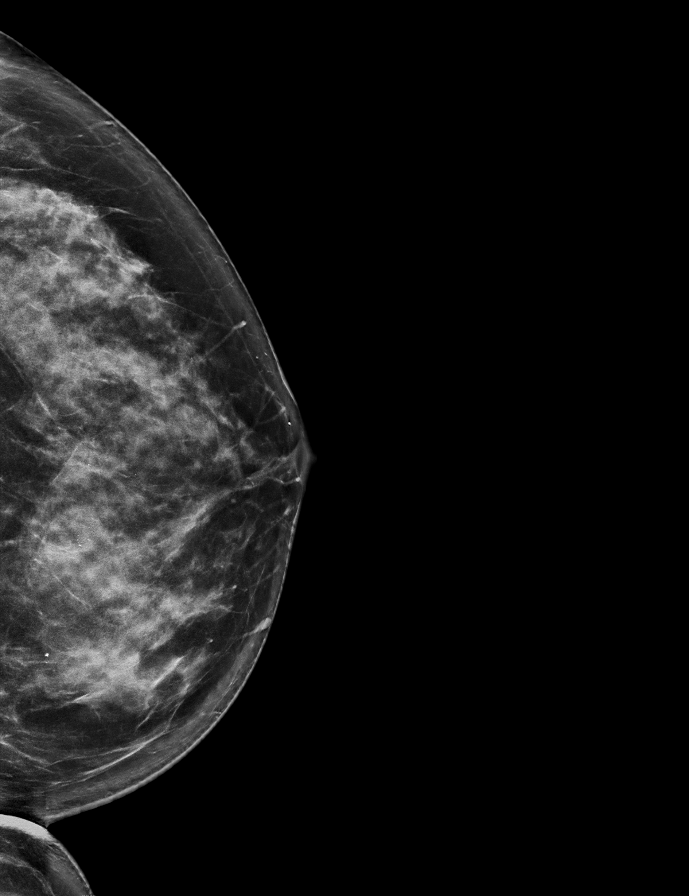

[R CC synth-2D]
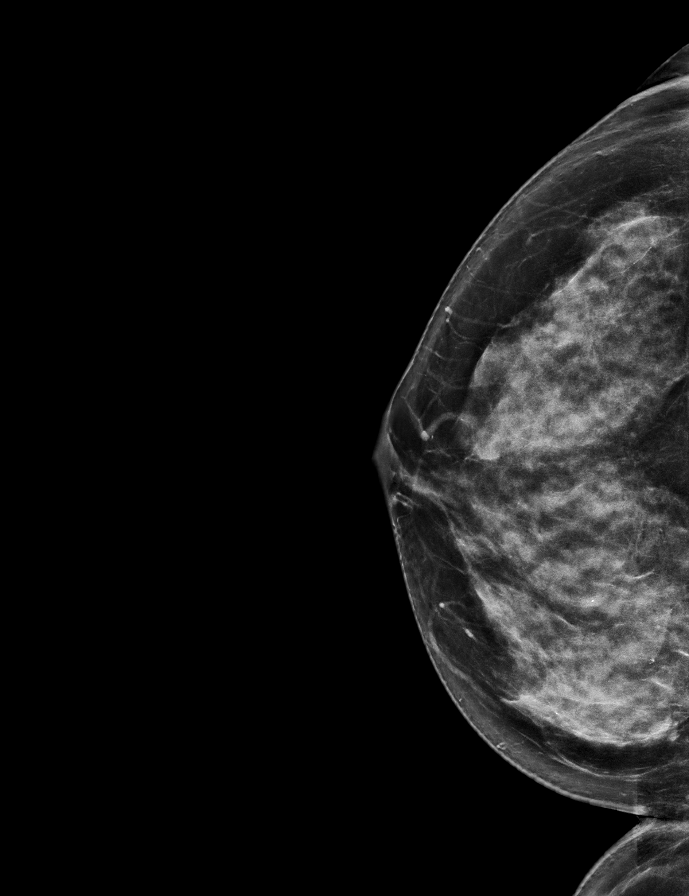

[R CC tomo · 2 of 75 frames shown]
[frame 25/75]
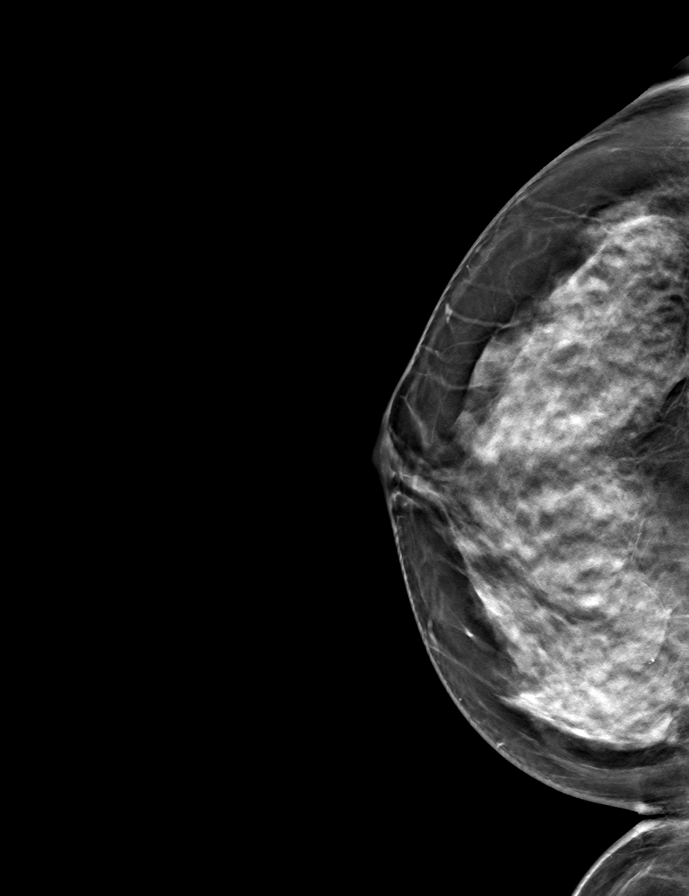
[frame 38/75]
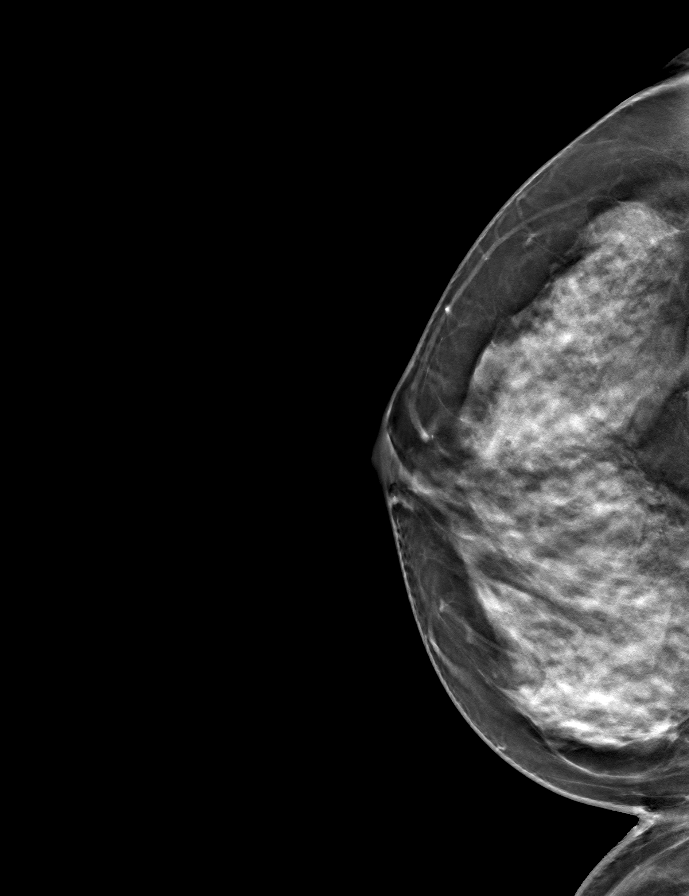

[L CC tomo · tomo slice 40/79.0]
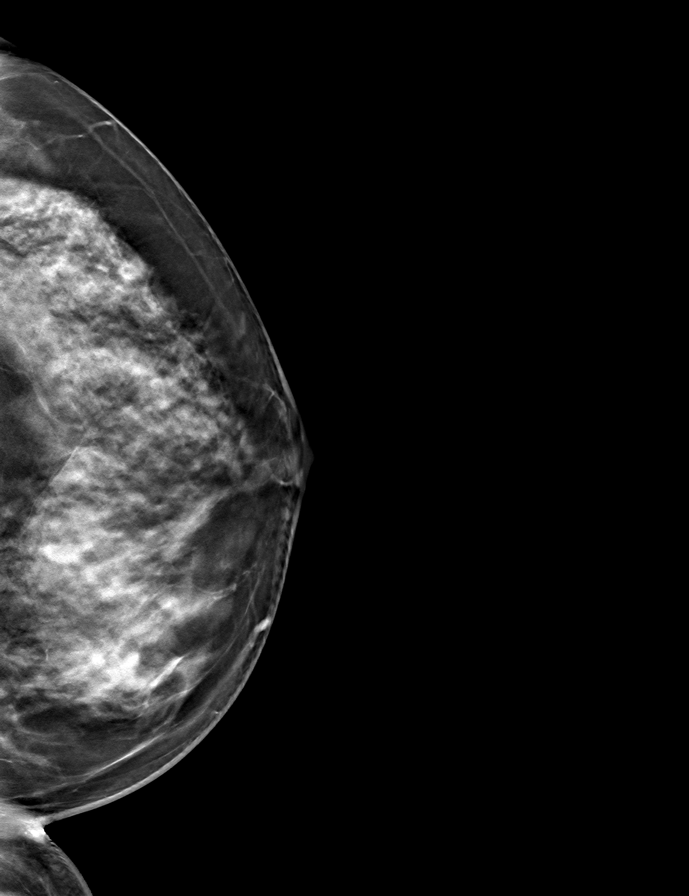

[R MLO tomo · tomo slice 37/73.0]
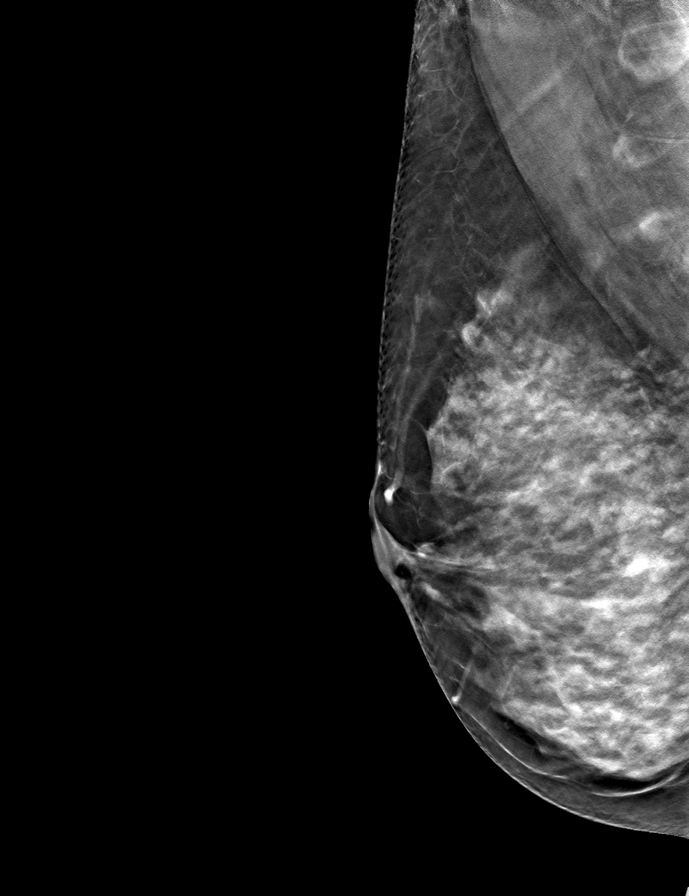

[L MLO tomo · tomo slice 40/79.0]
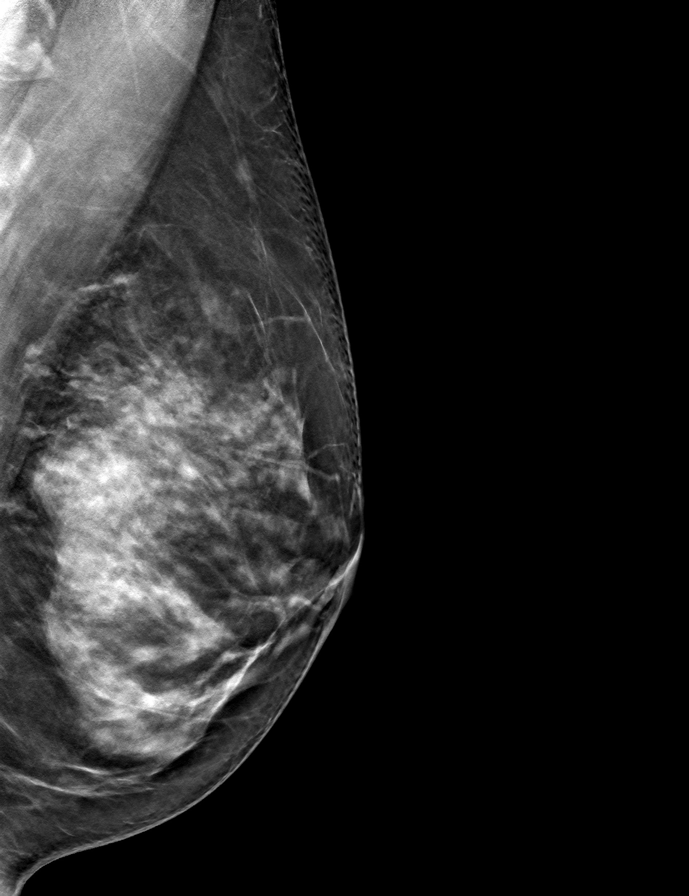

[9 of 24 positions shown; findings below may reference images not displayed]

ACR Breast Density Category c: The breast tissue is heterogeneously
dense, which may obscure small masses.
FINDINGS: There are no findings suspicious for malignancy.
IMPRESSION: No mammographic evidence of malignancy. A result letter of this
screening mammogram will be mailed directly to the patient.

RECOMMENDATION:
Screening mammogram in one year. (Code:Q3-W-BC3)

BI-RADS CATEGORY  1: Negative.

## 2023-05-25 ENCOUNTER — Other Ambulatory Visit: Payer: Self-pay | Admitting: Family

## 2023-05-25 DIAGNOSIS — K219 Gastro-esophageal reflux disease without esophagitis: Secondary | ICD-10-CM

## 2023-05-25 DIAGNOSIS — N951 Menopausal and female climacteric states: Secondary | ICD-10-CM

## 2023-06-26 ENCOUNTER — Ambulatory Visit: Payer: Managed Care, Other (non HMO) | Admitting: Family

## 2023-08-25 ENCOUNTER — Other Ambulatory Visit: Payer: Self-pay | Admitting: Family

## 2023-08-25 DIAGNOSIS — K219 Gastro-esophageal reflux disease without esophagitis: Secondary | ICD-10-CM

## 2023-08-25 DIAGNOSIS — N951 Menopausal and female climacteric states: Secondary | ICD-10-CM

## 2023-08-27 ENCOUNTER — Other Ambulatory Visit: Payer: Self-pay | Admitting: Family

## 2023-08-27 DIAGNOSIS — K219 Gastro-esophageal reflux disease without esophagitis: Secondary | ICD-10-CM

## 2023-08-27 DIAGNOSIS — N951 Menopausal and female climacteric states: Secondary | ICD-10-CM

## 2023-09-26 ENCOUNTER — Other Ambulatory Visit: Payer: Self-pay

## 2023-09-26 ENCOUNTER — Emergency Department: Payer: Managed Care, Other (non HMO)

## 2023-09-26 ENCOUNTER — Encounter: Payer: Self-pay | Admitting: Intensive Care

## 2023-09-26 ENCOUNTER — Emergency Department
Admission: EM | Admit: 2023-09-26 | Discharge: 2023-09-27 | Disposition: A | Discharge status: Home / Self Care | Payer: Managed Care, Other (non HMO) | Attending: Emergency Medicine | Admitting: Emergency Medicine

## 2023-09-26 DIAGNOSIS — H538 Other visual disturbances: Secondary | ICD-10-CM | POA: Diagnosis not present

## 2023-09-26 DIAGNOSIS — R42 Dizziness and giddiness: Secondary | ICD-10-CM | POA: Diagnosis present

## 2023-09-26 DIAGNOSIS — R519 Headache, unspecified: Secondary | ICD-10-CM | POA: Insufficient documentation

## 2023-09-26 HISTORY — DX: Gastro-esophageal reflux disease without esophagitis: K21.9

## 2023-09-26 LAB — URINALYSIS, ROUTINE W REFLEX MICROSCOPIC
Bilirubin Urine: NEGATIVE
Glucose, UA: NEGATIVE mg/dL
Hgb urine dipstick: NEGATIVE
Ketones, ur: NEGATIVE mg/dL
Leukocytes,Ua: NEGATIVE
Nitrite: NEGATIVE
Protein, ur: NEGATIVE mg/dL
Specific Gravity, Urine: 1.009 (ref 1.005–1.030)
pH: 6 (ref 5.0–8.0)

## 2023-09-26 LAB — CBC
HCT: 40.7 % (ref 36.0–46.0)
Hemoglobin: 14.1 g/dL (ref 12.0–15.0)
MCH: 33.1 pg (ref 26.0–34.0)
MCHC: 34.6 g/dL (ref 30.0–36.0)
MCV: 95.5 fL (ref 80.0–100.0)
Platelets: 303 10*3/uL (ref 150–400)
RBC: 4.26 MIL/uL (ref 3.87–5.11)
RDW: 11.5 % (ref 11.5–15.5)
WBC: 7.7 10*3/uL (ref 4.0–10.5)
nRBC: 0 % (ref 0.0–0.2)

## 2023-09-26 LAB — BASIC METABOLIC PANEL
Anion gap: 12 (ref 5–15)
BUN: 18 mg/dL (ref 6–20)
CO2: 27 mmol/L (ref 22–32)
Calcium: 9.9 mg/dL (ref 8.9–10.3)
Chloride: 100 mmol/L (ref 98–111)
Creatinine, Ser: 0.73 mg/dL (ref 0.44–1.00)
GFR, Estimated: >60 mL/min (ref >60)
Glucose, Bld: 134 mg/dL — ABNORMAL HIGH (ref 70–99)
Potassium: 3.4 mmol/L — ABNORMAL LOW (ref 3.5–5.1)
Sodium: 139 mmol/L (ref 135–145)

## 2023-09-26 MED ORDER — ALPRAZOLAM 0.25 MG PO TABS
0.2500 mg | ORAL_TABLET | Freq: Once | ORAL | Status: AC | PRN
  Administered 2023-09-26: 0.25 mg via ORAL
  Filled 2023-09-26: qty 1

## 2023-09-26 MED ORDER — MECLIZINE HCL 25 MG PO TABS
25.0000 mg | ORAL_TABLET | Freq: Once | ORAL | Status: AC
  Administered 2023-09-26: 25 mg via ORAL
  Filled 2023-09-26: qty 1

## 2023-09-26 MED ORDER — MECLIZINE HCL 25 MG PO TABS: 25.0000 mg | ORAL_TABLET | Freq: Three times a day (TID) | ORAL | 1 refills | Status: AC | PRN

## 2023-09-26 NOTE — ED Notes (Signed)
Pt to MRI

## 2023-09-26 NOTE — ED Provider Notes (Signed)
.-----------------------------------------   11:41 PM on 09/26/2023 -----------------------------------------  Blood pressure (!) 148/95, pulse 66, temperature 98 F (36.7 C), temperature source Oral, resp. rate 15, height 5\' 1"  (1.549 m), weight 55.3 kg, SpO2 98%.  Assuming care from Dr. Scotty Court.  In short, Lauren Leach is a 53 y.o. female with a chief complaint of Dizziness .  Refer to the original H&P for additional details.  The current plan of care is to follow-up MRI if negative, patient is able to be discharged home.  On reassessment patient is asymptomatic, asking to go home.  Independent review of imaging, MRI brain as well as angio without any acute findings.  She was given a prescription for meclizine by the prior physician.  Instructed follow-up with primary care doctor for further management, strict return precautions given.  Considered but no indication for inpatient mission at this time, she is safe for outpatient management.  Discharge.  Clinical Course as of 09/27/23 0016  Wed Sep 26, 2023  2306 Signed out pending MRI. With vertigo and vision changes, sxs resolved. Dc after MRI if nothing acute.  [TT]  Thu Sep 27, 2023  0016 MR BRAIN WO CONTRAST IMPRESSION: No evidence of acute intracranial abnormality.   [TT]  0016 MR ANGIO HEAD WO CONTRAST IMPRESSION: Normal intracranial MR angiography of the large and medium size vessels.   [TT]    Clinical Course User Index [TT] Claybon Jabs, MD      Claybon Jabs, MD 09/27/23 [phone]

## 2023-09-26 NOTE — ED Triage Notes (Signed)
Patient reports while at work she started having blurry vision and room was moving in circles. Also reports it would look like 1 person was three people.    At this moment dizzy and nausea. Denies vision issues at this time.

## 2023-09-26 NOTE — ED Provider Notes (Signed)
[facility] Provider Note    Event Date/Time   First MD Initiated Contact with Patient 09/26/23 2118     (approximate)   History   Chief Complaint: Dizziness   HPI  Lauren Leach is a 53 y.o. female with a history of GERD, anxiety, allergic rhinitis who comes ED complaining of sudden onset of blurry vision and vertigo that started while she was at work, waiting to end her day.  She works at [employer].  Reports that afterward she started having a mild headache.  Currently vision is back to normal, she denies any paresthesias motor weakness falls recent head trauma fevers or neck pain.  Denies history of migraine except for once about 30 years ago.  Denies recent illness.  No ear pain or muffled hearing.         Physical Exam   Triage Vital Signs: ED Triage Vitals  Encounter Vitals Group     BP 09/26/23 1816 (!) 151/102     Systolic BP Percentile --      Diastolic BP Percentile --      Pulse Rate 09/26/23 1814 74     Resp 09/26/23 1814 18     Temp 09/26/23 1814 97.9 F (36.6 C)     Temp Source 09/26/23 1814 Oral     SpO2 09/26/23 1814 99 %     Weight 09/26/23 1818 122 lb (55.3 kg)     Height 09/26/23 1818 5\' 1"  (1.549 m)     Head Circumference --      Peak Flow --      Pain Score 09/26/23 1816 0     Pain Loc --      Pain Education --      Exclude from Growth Chart --     Most recent vital signs: Vitals:   09/26/23 2130 09/26/23 2230  BP: (!) 138/99 (!) 148/95  Pulse: 75 66  Resp: 17 15  Temp:    SpO2: 100% 98%    General: Awake, no distress.  CV:  Good peripheral perfusion.  Regular rate rhythm Resp:  Normal effort.  Abd:  No distention.  Other:  Cranial nerves III through XII intact.  No nystagmus, no APD.  External canals and TMs normal bilaterally.  Oropharynx normal.  No motor drift.  Normal gait.  Normal finger-to-nose, normal language.  NIH stroke scale 0.  Patient does report subjective persistent vertiginous symptoms.   ED  Results / Procedures / Treatments   Labs (all labs ordered are listed, but only abnormal results are displayed) Labs Reviewed  BASIC METABOLIC PANEL - Abnormal; Notable for the following components:      Result Value   Potassium 3.4 (*)    Glucose, Bld 134 (*)    All other components within normal limits  URINALYSIS, ROUTINE W REFLEX MICROSCOPIC - Abnormal; Notable for the following components:   Color, Urine STRAW (*)    APPearance CLEAR (*)    All other components within normal limits  CBC     EKG Interpreted by me Normal sinus rhythm rate of 73.  Normal axis intervals QRS ST segments and T waves   RADIOLOGY CT head interpreted by me, negative for mass or intracranial hemorrhage.  Radiology report reviewed   PROCEDURES:  Procedures   MEDICATIONS ORDERED IN ED: Medications  meclizine (ANTIVERT) tablet 25 mg (25 mg Oral Given 09/26/23 2225)  ALPRAZolam (XANAX) tablet 0.25 mg (0.25 mg Oral Given 09/26/23 2225)     IMPRESSION /  MDM / ASSESSMENT AND PLAN / ED COURSE  I reviewed the triage vital signs and the nursing notes.  DDx: Intracranial mass, intracranial hemorrhage, peripheral vertigo, electrolyte derangement, UTI, anemia, AKI, viral labyrinthitis, dural sinus thrombosis, cerebellar stroke  Patient's presentation is most consistent with acute presentation with potential threat to life or bodily function.  Patient presents with symptoms of vertigo, most likely peripheral.  However, with persistent vertiginous symptoms without a past history of similar episodes or migraine syndrome, will obtain MRI/MRA.  If negative, can be treated with meclizine and outpatient follow-up.       FINAL CLINICAL IMPRESSION(S) / ED DIAGNOSES   Final diagnoses:  Vertigo     Rx / DC Orders   ED Discharge Orders          Ordered    meclizine (ANTIVERT) 25 MG tablet  3 times daily PRN        09/26/23 2301             Note:  This document was prepared using Dragon voice  recognition software and may include unintentional dictation errors.   Sharman Cheek, MD 09/26/23 [phone]

## 2023-10-03 ENCOUNTER — Encounter: Payer: Self-pay | Admitting: Family

## 2023-10-03 ENCOUNTER — Telehealth (INDEPENDENT_AMBULATORY_CARE_PROVIDER_SITE_OTHER): Payer: Managed Care, Other (non HMO) | Admitting: Family

## 2023-10-03 VITALS — Ht 61.0 in | Wt 122.2 lb

## 2023-10-03 DIAGNOSIS — R42 Dizziness and giddiness: Secondary | ICD-10-CM

## 2023-10-03 DIAGNOSIS — G8929 Other chronic pain: Secondary | ICD-10-CM | POA: Diagnosis not present

## 2023-10-03 DIAGNOSIS — R519 Headache, unspecified: Secondary | ICD-10-CM | POA: Diagnosis not present

## 2023-10-03 NOTE — Patient Instructions (Addendum)
 Start magnesium citrate 400mg  daily.   Take 0.5 to 5mg  melatonin at 7pm with dinner -this is when natural melatonin will start to increase  You may also trial melatonin combination over the counter supplement such as Sleep#3 or Qunol Sleep 5 in 1  Referral to ENT, ophthalmologist  Let us know if you dont hear back within a week in regards to an appointment being scheduled.   So that you are aware, if you are [facility] MyChart user , please pay attention to your MyChart messages as you may receive a MyChart message with a phone number to call and schedule this test/appointment own your own from our referral coordinator. This is a new process so I do not want you to miss this message.  If you are not a MyChart user, you will receive a phone call.    Please let me know how you are doing.

## 2023-10-03 NOTE — Progress Notes (Signed)
 Virtual Visit via Video Note  I connected with Lauren Leach on 10/08/23 at  9:30 AM EST by a video enabled telemedicine application and verified that I am speaking with the correct person using two identifiers. Location patient: home Location provider: home Persons participating in the virtual visit: patient, provider  I discussed the limitations of evaluation and management by telemedicine and the availability of in person appointments. The patient expressed understanding and agreed to proceed.  HPI:  Follow-up ED She had been at work one week ago when she developed sudden vision changes and the room started to spin.  She notes that she had used ipad and computer monitor that day , she notes that all the sudden vision was foggy and white. Almost like a 'kaleidoscope. '  The room started to spin.   No complete vision loss.  No associated double vision, eye pain, pulsatile tinnitus .  She could focus a little when she closed one eye.  She has chronic dry eye and told her that he eyes dont close when sleeping. She wears bifocal glasses.    Vision completely returned in 3 hours. She was able to walk a straight line.   She feels well today. No new complaints.   Slight HA in the morning which she attributes to neck pain or sinus pain.   A few days after ED visit, she had right sided tinnitus, since resolved.    Trouble falling and staying asleep  No regular NSAIDs. She drinks one cup of coffee per day.   Presented to the ED 09/26/2023 sudden onset of blurry vision and vertigo while she was at work.  Associated with headache.  No trauma, fever, hearing loss. Remote history of migraine, approximately 30 years ago. Provided Antivert, Xanax at discharge. CT head negative for acute intracranial abnormality. MRI MRA negative acute intracranial abnormality.  Normal intracranial MR angiography  Optometry appointment 10/18/23 [facility].    Never smoker   Ho recurrent allergies. Singulair  is not effective.    ROS: See pertinent positives and negatives per HPI.  EXAM:  VITALS per patient if applicable: Ht 5\' 1"  (1.549 m)   Wt 122 lb 3.2 oz (55.4 kg)   LMP  (LMP Unknown)   BMI 23.09 kg/m  BP Readings from Last 3 Encounters:  09/27/23 (!) 143/102  12/25/22 128/78  10/23/22 132/80   Wt Readings from Last 3 Encounters:  10/03/23 122 lb 3.2 oz (55.4 kg)  09/26/23 122 lb (55.3 kg)  12/25/22 118 lb 3.2 oz (53.6 kg)    GENERAL: alert, oriented, appears well and in no acute distress  HEENT: atraumatic, conjunttiva clear, no obvious abnormalities on inspection of external nose and ears  NECK: normal movements of the head and neck  LUNGS: on inspection no signs of respiratory distress, breathing rate appears normal, no obvious gross SOB, gasping or wheezing  CV: no obvious cyanosis  MS: moves all visible extremities without noticeable abnormality  PSYCH/NEURO: pleasant and cooperative, no obvious depression or anxiety, speech and thought processing grossly intact  ASSESSMENT AND PLAN: Chronic nonintractable headache, unspecified headache type -     Ambulatory referral to ENT -     Ambulatory referral to Ophthalmology  Vertigo Assessment & Plan: Reviewed emergency room visit.  Discussed differentials including vertigo, migraine with vision changes.  Discussed aggravating features for headache including cervicogenic headache, poor sleep quality. Advised to start magnesium citrate 400mg  daily with or without melatonin, melatonin supplement..   Referral to ENT, ophthalmologist for further  evaluation of sinus disease, vertigo, ophthalmic disease including dry eye.       -we discussed possible serious and likely etiologies, options for evaluation and workup, limitations of telemedicine visit vs in person visit, treatment, treatment risks and precautions. Pt prefers to treat via telemedicine empirically rather then risking or undertaking an in person visit at this  moment.    I discussed the assessment and treatment plan with the patient. The patient was provided an opportunity to ask questions and all were answered. The patient agreed with the plan and demonstrated an understanding of the instructions.   The patient was advised to call back or seek an in-person evaluation if the symptoms worsen or if the condition fails to improve as anticipated.  Advised if desired AVS can be mailed or viewed via MyChart if Mychart user.   Rennie Plowman, FNP

## 2023-10-08 DIAGNOSIS — R42 Dizziness and giddiness: Secondary | ICD-10-CM | POA: Insufficient documentation

## 2023-10-08 NOTE — Assessment & Plan Note (Signed)
 Reviewed emergency room visit.  Discussed differentials including vertigo, migraine with vision changes.  Discussed aggravating features for headache including cervicogenic headache, poor sleep quality. Advised to start magnesium citrate 400mg  daily with or without melatonin, melatonin supplement..   Referral to ENT, ophthalmologist for further evaluation of sinus disease, vertigo, ophthalmic disease including dry eye.

## 2024-02-21 ENCOUNTER — Other Ambulatory Visit: Payer: Self-pay | Admitting: Family

## 2024-02-21 DIAGNOSIS — J3081 Allergic rhinitis due to animal (cat) (dog) hair and dander: Secondary | ICD-10-CM

## 2024-08-27 ENCOUNTER — Telehealth: Payer: Self-pay

## 2024-08-27 DIAGNOSIS — Z0000 Encounter for general adult medical examination without abnormal findings: Secondary | ICD-10-CM

## 2024-08-27 NOTE — Telephone Encounter (Signed)
 Called pt was unable to get through due to network being out sent pt my chart message to inform her that labs were in and she can schedule appt before her physical  on 09/16/24

## 2024-08-27 NOTE — Telephone Encounter (Signed)
 Copied from CRM [id number]. Topic: Clinical - Request for Lab/Test Order >> Aug 27, 2024  9:46 AM Winona R wrote: Pt need lab orders prior to Feb physical >> Aug 27, 2024  9:51 AM Winona R wrote: Pt would like to go to [facility]  Address: [address] [place], [place] [address] Phone: [phone]

## 2024-08-27 NOTE — Addendum Note (Signed)
 Addended by: Martinez Boxx on: 08/27/2024 03:18 PM   Modules accepted: Orders

## 2024-08-28 ENCOUNTER — Other Ambulatory Visit: Payer: Self-pay

## 2024-08-28 NOTE — Telephone Encounter (Signed)
 Pt is aware of lab orders.

## 2024-08-28 NOTE — Addendum Note (Signed)
 Addended by: Nakota Ackert on: 08/28/2024 02:15 PM   Modules accepted: Orders

## 2024-09-12 LAB — COMPREHENSIVE METABOLIC PANEL WITH GFR
ALT: 69 [IU]/L — ABNORMAL HIGH (ref 0–32)
AST: 64 [IU]/L — ABNORMAL HIGH (ref 0–40)
Albumin: 4.6 g/dL (ref 3.8–4.9)
Alkaline Phosphatase: 108 [IU]/L (ref 49–135)
BUN/Creatinine Ratio: 18 (ref 9–23)
BUN: 15 mg/dL (ref 6–24)
Bilirubin Total: 0.4 mg/dL (ref 0.0–1.2)
CO2: 23 mmol/L (ref 20–29)
Calcium: 10.2 mg/dL (ref 8.7–10.2)
Chloride: 101 mmol/L (ref 96–106)
Creatinine, Ser: 0.82 mg/dL (ref 0.57–1.00)
Globulin, Total: 2.5 g/dL (ref 1.5–4.5)
Glucose: 103 mg/dL — ABNORMAL HIGH (ref 70–99)
Potassium: 4.5 mmol/L (ref 3.5–5.2)
Sodium: 142 mmol/L (ref 134–144)
Total Protein: 7.1 g/dL (ref 6.0–8.5)
eGFR: 86 mL/min/{1.73_m2}

## 2024-09-12 LAB — CBC WITH DIFFERENTIAL/PLATELET
Basophils Absolute: 0 10*3/uL (ref 0.0–0.2)
Basos: 1 %
EOS (ABSOLUTE): 0.1 10*3/uL (ref 0.0–0.4)
Eos: 3 %
Hematocrit: 39 % (ref 34.0–46.6)
Hemoglobin: 12.8 g/dL (ref 11.1–15.9)
Immature Grans (Abs): 0 10*3/uL (ref 0.0–0.1)
Immature Granulocytes: 0 %
Lymphocytes Absolute: 2.6 10*3/uL (ref 0.7–3.1)
Lymphs: 55 %
MCH: 32.3 pg (ref 26.6–33.0)
MCHC: 32.8 g/dL (ref 31.5–35.7)
MCV: 99 fL — ABNORMAL HIGH (ref 79–97)
Monocytes Absolute: 0.4 10*3/uL (ref 0.1–0.9)
Monocytes: 8 %
Neutrophils Absolute: 1.6 10*3/uL (ref 1.4–7.0)
Neutrophils: 33 %
Platelets: 294 10*3/uL (ref 150–450)
RBC: 3.96 x10E6/uL (ref 3.77–5.28)
RDW: 11.3 % — ABNORMAL LOW (ref 11.7–15.4)
WBC: 4.7 10*3/uL (ref 3.4–10.8)

## 2024-09-12 LAB — TSH: TSH: 4.49 u[IU]/mL (ref 0.450–4.500)

## 2024-09-12 LAB — LIPID PANEL
Chol/HDL Ratio: 4.6 ratio — ABNORMAL HIGH (ref 0.0–4.4)
Cholesterol, Total: 261 mg/dL — ABNORMAL HIGH (ref 100–199)
HDL: 57 mg/dL
LDL Chol Calc (NIH): 176 mg/dL — ABNORMAL HIGH (ref 0–99)
Triglycerides: 153 mg/dL — ABNORMAL HIGH (ref 0–149)
VLDL Cholesterol Cal: 28 mg/dL (ref 5–40)

## 2024-09-12 LAB — HEMOGLOBIN A1C
Est. average glucose Bld gHb Est-mCnc: 117 mg/dL
Hgb A1c MFr Bld: 5.7 % — ABNORMAL HIGH (ref 4.8–5.6)

## 2024-09-16 ENCOUNTER — Encounter: Payer: Self-pay | Admitting: Family

## 2024-10-07 ENCOUNTER — Encounter: Payer: Self-pay | Admitting: Family
